# Patient Record
Sex: Male | Born: 1940 | Race: White | Hispanic: No | Marital: Married | Smoking: Former smoker
Health system: Southern US, Community
[De-identification: ages and names within clinical notes are randomized; demographics above are authoritative.]

## PROBLEM LIST (undated history)

## (undated) DIAGNOSIS — Z87442 Personal history of urinary calculi: Secondary | ICD-10-CM

## (undated) DIAGNOSIS — H409 Unspecified glaucoma: Secondary | ICD-10-CM

## (undated) DIAGNOSIS — E119 Type 2 diabetes mellitus without complications: Secondary | ICD-10-CM

## (undated) DIAGNOSIS — E785 Hyperlipidemia, unspecified: Secondary | ICD-10-CM

## (undated) DIAGNOSIS — I10 Essential (primary) hypertension: Secondary | ICD-10-CM

## (undated) DIAGNOSIS — J189 Pneumonia, unspecified organism: Secondary | ICD-10-CM

## (undated) DIAGNOSIS — T4145XA Adverse effect of unspecified anesthetic, initial encounter: Secondary | ICD-10-CM

## (undated) HISTORY — PX: TONSILLECTOMY: SUR1361

## (undated) HISTORY — PX: KIDNEY STONE SURGERY: SHX686

## (undated) HISTORY — PX: EYE SURGERY: SHX253

## (undated) HISTORY — DX: Type 2 diabetes mellitus without complications: E11.9

## (undated) HISTORY — DX: Hyperlipidemia, unspecified: E78.5

## (undated) HISTORY — PX: HERNIA REPAIR: SHX51

## (undated) HISTORY — PX: OTHER SURGICAL HISTORY: SHX169

---

## 2003-02-02 ENCOUNTER — Ambulatory Visit (HOSPITAL_COMMUNITY): Admission: RE | Admit: 2003-02-02 | Discharge: 2003-02-03 | Payer: Self-pay | Admitting: Ophthalmology

## 2003-02-11 ENCOUNTER — Ambulatory Visit (HOSPITAL_COMMUNITY): Admission: RE | Admit: 2003-02-11 | Discharge: 2003-02-12 | Payer: Self-pay | Admitting: Ophthalmology

## 2003-10-26 ENCOUNTER — Ambulatory Visit (HOSPITAL_COMMUNITY): Admission: RE | Admit: 2003-10-26 | Discharge: 2003-10-27 | Payer: Self-pay | Admitting: Ophthalmology

## 2004-12-14 ENCOUNTER — Ambulatory Visit (HOSPITAL_COMMUNITY): Admission: RE | Admit: 2004-12-14 | Discharge: 2004-12-14 | Payer: Self-pay | Admitting: Ophthalmology

## 2005-01-02 ENCOUNTER — Ambulatory Visit (HOSPITAL_COMMUNITY): Admission: RE | Admit: 2005-01-02 | Discharge: 2005-01-02 | Payer: Self-pay | Admitting: Ophthalmology

## 2005-06-04 ENCOUNTER — Ambulatory Visit (HOSPITAL_COMMUNITY): Admission: RE | Admit: 2005-06-04 | Discharge: 2005-06-04 | Payer: Self-pay | Admitting: Ophthalmology

## 2012-08-18 ENCOUNTER — Encounter (INDEPENDENT_AMBULATORY_CARE_PROVIDER_SITE_OTHER): Payer: Self-pay | Admitting: Surgery

## 2012-08-18 ENCOUNTER — Ambulatory Visit (INDEPENDENT_AMBULATORY_CARE_PROVIDER_SITE_OTHER): Payer: BC Managed Care – PPO | Admitting: Surgery

## 2012-08-18 VITALS — BP 123/80 | HR 79 | Temp 98.6°F | Resp 18 | Ht 67.0 in | Wt 186.8 lb

## 2012-08-18 DIAGNOSIS — K4090 Unilateral inguinal hernia, without obstruction or gangrene, not specified as recurrent: Secondary | ICD-10-CM

## 2012-08-18 NOTE — Progress Notes (Signed)
Patient ID: Adam Whitaker, male   DOB: 09/19/1940, 72 y.o.   MRN: [id number]  Chief Complaint  Patient presents with  . Hernia    HPI [name] is a 72 y.o. male.  Referred by Dr. Karrar Husain for evaluation of right inguinal hernia HPI This is a 72-year-old male in good health who presents with a history of a small bulge in his right groin. This is been present for for 5 years. It has become larger and more noticeable. The patient is fairly active. He is planning on participating in an over-70 body building competition. He exercises vigorously and what to have this hernia fixed as it is interfering with his daily activities. He denies any obstructive symptoms. The hernia remains reducible. Past Medical History  Diagnosis Date  . Diabetes mellitus without complication   . Hyperlipidemia     Past Surgical History  Procedure Date  . Kidney stone surgery     No family history on file.  Social History History  Substance Use Topics  . Smoking status: Never Smoker   . Smokeless tobacco: Not on file  . Alcohol Use: No    No Known Allergies  Current Outpatient Prescriptions  Medication Sig Dispense Refill  . amLODipine (NORVASC) 5 MG tablet Take 5 mg by mouth daily.      . atorvastatin (LIPITOR) 10 MG tablet Take 10 mg by mouth daily.      . beta carotene w/minerals (OCUVITE) tablet Take 1 tablet by mouth daily.      . cholecalciferol (VITAMIN D) 1000 UNITS tablet Take 1,000 Units by mouth daily.      . fish oil-omega-3 fatty acids 1000 MG capsule Take 2 g by mouth daily.      . metFORMIN (GLUCOPHAGE) 500 MG tablet Take 500 mg by mouth 2 (two) times daily with a meal.        Review of Systems Review of Systems  Constitutional: Negative for fever, chills and unexpected weight change.  HENT: Negative for hearing loss, congestion, sore throat, trouble swallowing and voice change.   Eyes: Negative for visual disturbance.  Respiratory: Negative for cough and wheezing.    Cardiovascular: Negative for chest pain, palpitations and leg swelling.  Gastrointestinal: Negative for nausea, vomiting, abdominal pain, diarrhea, constipation, blood in stool, abdominal distention, anal bleeding and rectal pain.  Genitourinary: Positive for scrotal swelling (Right groin swelling). Negative for hematuria and difficulty urinating.  Musculoskeletal: Negative for arthralgias.  Skin: Negative for rash and wound.  Neurological: Negative for seizures, syncope, weakness and headaches.  Hematological: Negative for adenopathy. Does not bruise/bleed easily.  Psychiatric/Behavioral: Negative for confusion.    Blood pressure 123/80, pulse 79, temperature 98.6 F (37 C), temperature source Temporal, resp. rate 18, height 5' 7" (1.702 m), weight 186 lb 12.8 oz (84.732 kg).  Physical Exam Physical Exam WDWN in NAD HEENT:  EOMI, sclera anicteric Neck:  No masses, no thyromegaly Lungs:  CTA bilaterally; normal respiratory effort CV:  Regular rate and rhythm; no murmurs Abd:  +bowel sounds, soft, non-tender, no masses GU:  Bilateral descended testes; no testicular masses; reducible right inguinal hernia; no sign of left inguinal hernia  Ext:  Well-perfused; no edema Skin:  Warm, dry; no sign of jaundice  Data Reviewed none  Assessment    Right inguinal hernia - reducible    Plan    Right inguinal hernia repair with mesh.  The surgical procedure has been discussed with the patient.  Potential risks, benefits, alternative treatments,   and expected outcomes have been explained.  All of the patient's questions at this time have been answered.  The likelihood of reaching the patient's treatment goal is good.  The patient understand the proposed surgical procedure and wishes to proceed.        [name]. 08/18/2012, 2:38 PM    

## 2012-08-29 ENCOUNTER — Encounter (HOSPITAL_COMMUNITY): Payer: Self-pay | Admitting: Pharmacy Technician

## 2012-09-01 ENCOUNTER — Encounter (HOSPITAL_COMMUNITY)
Admission: RE | Admit: 2012-09-01 | Discharge: 2012-09-01 | Disposition: A | Discharge status: Home / Self Care | Payer: BC Managed Care – PPO | Source: Ambulatory Visit | Attending: Surgery | Admitting: Surgery

## 2012-09-01 ENCOUNTER — Encounter (HOSPITAL_COMMUNITY)
Admission: RE | Admit: 2012-09-01 | Discharge: 2012-09-01 | Disposition: A | Discharge status: Home / Self Care | Payer: BC Managed Care – PPO | Source: Ambulatory Visit | Attending: Anesthesiology | Admitting: Anesthesiology

## 2012-09-01 ENCOUNTER — Encounter (HOSPITAL_COMMUNITY): Payer: Self-pay

## 2012-09-01 HISTORY — DX: Essential (primary) hypertension: I10

## 2012-09-01 LAB — BASIC METABOLIC PANEL
Calcium: 10.4 mg/dL (ref 8.4–10.5)
Creatinine, Ser: 1.1 mg/dL (ref 0.50–1.35)
GFR calc Af Amer: 76 mL/min — ABNORMAL LOW (ref >90)
GFR calc non Af Amer: 66 mL/min — ABNORMAL LOW (ref >90)
Sodium: 140 mEq/L (ref 135–145)

## 2012-09-01 LAB — CBC
MCH: 31.3 pg (ref 26.0–34.0)
MCHC: 33.9 g/dL (ref 30.0–36.0)
MCV: 92.2 fL (ref 78.0–100.0)
Platelets: 186 10*3/uL (ref 150–400)
RDW: 13.5 % (ref 11.5–15.5)

## 2012-09-01 LAB — SURGICAL PCR SCREEN: MRSA, PCR: NEGATIVE

## 2012-09-01 NOTE — Pre-Procedure Instructions (Signed)
Adam Whitaker  09/01/2012   Your procedure is scheduled on:  Thursday, 09/11/2012@7 :30AM.  Report to [facility] at 5:30 AM.  Call this number if you have problems the morning of surgery: [phone]   Remember:   Do not eat food or drink liquids after midnight.   Take these medicines the morning of surgery with A SIP OF WATER: Amlodipine   Do not wear jewelry, make-up or nail polish.  Do not wear lotions, powders, or perfumes. You may wear deodorant.  Do not shave 48 hours prior to surgery. Men may shave face and neck.  Do not bring valuables to the hospital.  Contacts, dentures or bridgework may not be worn into surgery.  Leave suitcase in the car. After surgery it may be brought to your room.  For patients admitted to the hospital, checkout time is 11:00 AM the day of discharge.   Patients discharged the day of surgery will not be allowed to drive home.  Name and phone number of your driver: [name], wife  Special Instructions: Shower using CHG 2 nights before surgery and the night before surgery.  If you shower the day of surgery use CHG.  Use special wash - you have one bottle of CHG for all showers.  You should use approximately 1/3 of the bottle for each shower.   Please read over the following fact sheets that you were given: Pain Booklet, Coughing and Deep Breathing and Surgical Site Infection Prevention

## 2012-09-01 NOTE — Progress Notes (Signed)
Pt here for PAT.  Sleep study/apnea: Denies ECHO: Denies Stress: Denies Heart Cath: Denies  Family MD:  Dr. [facility].

## 2012-09-10 MED ORDER — CEFAZOLIN SODIUM-DEXTROSE 2-3 GM-% IV SOLR
2.0000 g | INTRAVENOUS | Status: AC
  Administered 2012-09-11: 2 g via INTRAVENOUS
  Filled 2012-09-10: qty 50

## 2012-09-10 MED ORDER — CHLORHEXIDINE GLUCONATE 4 % EX LIQD: 1.0000 "application " | Freq: Once | CUTANEOUS | Status: DC

## 2012-09-11 ENCOUNTER — Encounter (HOSPITAL_COMMUNITY): Payer: Self-pay | Admitting: Anesthesiology

## 2012-09-11 ENCOUNTER — Ambulatory Visit (HOSPITAL_COMMUNITY)
Admission: RE | Admit: 2012-09-11 | Discharge: 2012-09-11 | Disposition: A | Discharge status: Home / Self Care | Payer: BC Managed Care – PPO | Source: Ambulatory Visit | Attending: Surgery | Admitting: Surgery

## 2012-09-11 ENCOUNTER — Encounter (HOSPITAL_COMMUNITY)
Admission: RE | Disposition: A | Discharge status: Home / Self Care | Payer: Self-pay | Source: Ambulatory Visit | Attending: Surgery

## 2012-09-11 ENCOUNTER — Encounter (HOSPITAL_COMMUNITY): Payer: Self-pay | Admitting: *Deleted

## 2012-09-11 ENCOUNTER — Ambulatory Visit (HOSPITAL_COMMUNITY): Payer: BC Managed Care – PPO | Admitting: Anesthesiology

## 2012-09-11 DIAGNOSIS — E785 Hyperlipidemia, unspecified: Secondary | ICD-10-CM | POA: Insufficient documentation

## 2012-09-11 DIAGNOSIS — I10 Essential (primary) hypertension: Secondary | ICD-10-CM | POA: Insufficient documentation

## 2012-09-11 DIAGNOSIS — Z01818 Encounter for other preprocedural examination: Secondary | ICD-10-CM | POA: Insufficient documentation

## 2012-09-11 DIAGNOSIS — K4090 Unilateral inguinal hernia, without obstruction or gangrene, not specified as recurrent: Secondary | ICD-10-CM

## 2012-09-11 DIAGNOSIS — T8859XA Other complications of anesthesia, initial encounter: Secondary | ICD-10-CM

## 2012-09-11 DIAGNOSIS — E119 Type 2 diabetes mellitus without complications: Secondary | ICD-10-CM | POA: Insufficient documentation

## 2012-09-11 DIAGNOSIS — Z01812 Encounter for preprocedural laboratory examination: Secondary | ICD-10-CM | POA: Insufficient documentation

## 2012-09-11 DIAGNOSIS — Z01810 Encounter for preprocedural cardiovascular examination: Secondary | ICD-10-CM | POA: Insufficient documentation

## 2012-09-11 HISTORY — PX: INSERTION OF MESH: SHX5868

## 2012-09-11 HISTORY — PX: INGUINAL HERNIA REPAIR: SHX194

## 2012-09-11 HISTORY — DX: Other complications of anesthesia, initial encounter: T88.59XA

## 2012-09-11 LAB — GLUCOSE, CAPILLARY: Glucose-Capillary: 130 mg/dL — ABNORMAL HIGH (ref 70–99)

## 2012-09-11 SURGERY — REPAIR, HERNIA, INGUINAL, ADULT
Anesthesia: General | Site: Abdomen | Laterality: Right | Wound class: Clean

## 2012-09-11 MED ORDER — ONDANSETRON HCL 4 MG/2ML IJ SOLN: 4.0000 mg | INTRAMUSCULAR | Status: DC | PRN

## 2012-09-11 MED ORDER — 0.9 % SODIUM CHLORIDE (POUR BTL) OPTIME
TOPICAL | Status: DC | PRN
  Administered 2012-09-11: 1000 mL

## 2012-09-11 MED ORDER — ONDANSETRON HCL 4 MG/2ML IJ SOLN
INTRAMUSCULAR | Status: DC | PRN
  Administered 2012-09-11: 4 mg via INTRAVENOUS

## 2012-09-11 MED ORDER — OXYCODONE HCL 5 MG PO TABS: 5.0000 mg | ORAL_TABLET | Freq: Once | ORAL | Status: DC | PRN

## 2012-09-11 MED ORDER — PROMETHAZINE HCL 25 MG/ML IJ SOLN: 6.2500 mg | INTRAMUSCULAR | Status: DC | PRN

## 2012-09-11 MED ORDER — OXYCODONE-ACETAMINOPHEN 5-325 MG PO TABS: 1.0000 | ORAL_TABLET | ORAL | Status: DC | PRN

## 2012-09-11 MED ORDER — HYDROMORPHONE HCL PF 1 MG/ML IJ SOLN
INTRAMUSCULAR | Status: AC
  Filled 2012-09-11: qty 1

## 2012-09-11 MED ORDER — PROPOFOL 10 MG/ML IV BOLUS
INTRAVENOUS | Status: DC | PRN
  Administered 2012-09-11: 150 mg via INTRAVENOUS

## 2012-09-11 MED ORDER — HYDROMORPHONE HCL PF 1 MG/ML IJ SOLN
0.2500 mg | INTRAMUSCULAR | Status: DC | PRN
  Administered 2012-09-11 (×2): 0.5 mg via INTRAVENOUS

## 2012-09-11 MED ORDER — BUPIVACAINE-EPINEPHRINE 0.25% -1:200000 IJ SOLN
INTRAMUSCULAR | Status: DC | PRN
  Administered 2012-09-11: 30 mL

## 2012-09-11 MED ORDER — MIDAZOLAM HCL 5 MG/5ML IJ SOLN
INTRAMUSCULAR | Status: DC | PRN
  Administered 2012-09-11: 2 mg via INTRAVENOUS

## 2012-09-11 MED ORDER — LACTATED RINGERS IV SOLN
INTRAVENOUS | Status: DC | PRN
  Administered 2012-09-11 (×2): via INTRAVENOUS

## 2012-09-11 MED ORDER — FENTANYL CITRATE 0.05 MG/ML IJ SOLN
INTRAMUSCULAR | Status: DC | PRN
  Administered 2012-09-11: 25 ug via INTRAVENOUS
  Administered 2012-09-11: 100 ug via INTRAVENOUS

## 2012-09-11 MED ORDER — BUPIVACAINE-EPINEPHRINE PF 0.5-1:200000 % IJ SOLN
INTRAMUSCULAR | Status: DC | PRN
  Administered 2012-09-11: 100 mg

## 2012-09-11 MED ORDER — OXYCODONE HCL 5 MG/5ML PO SOLN: 5.0000 mg | Freq: Once | ORAL | Status: DC | PRN

## 2012-09-11 MED ORDER — BUPIVACAINE-EPINEPHRINE PF 0.25-1:200000 % IJ SOLN
INTRAMUSCULAR | Status: AC
  Filled 2012-09-11: qty 30

## 2012-09-11 MED ORDER — MORPHINE SULFATE 2 MG/ML IJ SOLN: 2.0000 mg | INTRAMUSCULAR | Status: DC | PRN

## 2012-09-11 SURGICAL SUPPLY — 50 items
APL SKNCLS STERI-STRIP NONHPOA (GAUZE/BANDAGES/DRESSINGS) ×1
BENZOIN TINCTURE PRP APPL 2/3 (GAUZE/BANDAGES/DRESSINGS) ×2 IMPLANT
BLADE SURG 15 STRL LF DISP TIS (BLADE) ×1 IMPLANT
BLADE SURG 15 STRL SS (BLADE) ×2
BLADE SURG ROTATE 9660 (MISCELLANEOUS) ×1 IMPLANT
CHLORAPREP W/TINT 26ML (MISCELLANEOUS) ×2 IMPLANT
CLOTH BEACON ORANGE TIMEOUT ST (SAFETY) ×2 IMPLANT
COVER SURGICAL LIGHT HANDLE (MISCELLANEOUS) ×2 IMPLANT
DECANTER SPIKE VIAL GLASS SM (MISCELLANEOUS) ×2 IMPLANT
DRAIN PENROSE 1/2X12 LTX STRL (WOUND CARE) ×1 IMPLANT
DRAPE LAPAROSCOPIC ABDOMINAL (DRAPES) IMPLANT
DRAPE LAPAROTOMY TRNSV 102X78 (DRAPE) ×1 IMPLANT
DRAPE UTILITY 15X26 W/TAPE STR (DRAPE) ×4 IMPLANT
DRSG TEGADERM 4X4.75 (GAUZE/BANDAGES/DRESSINGS) ×2 IMPLANT
ELECT CAUTERY BLADE 6.4 (BLADE) ×2 IMPLANT
ELECT REM PT RETURN 9FT ADLT (ELECTROSURGICAL) ×2
ELECTRODE REM PT RTRN 9FT ADLT (ELECTROSURGICAL) ×1 IMPLANT
GAUZE SPONGE 4X4 16PLY XRAY LF (GAUZE/BANDAGES/DRESSINGS) ×2 IMPLANT
GLOVE BIO SURGEON STRL SZ7 (GLOVE) ×2 IMPLANT
GLOVE BIOGEL PI IND STRL 7.5 (GLOVE) ×1 IMPLANT
GLOVE BIOGEL PI INDICATOR 7.5 (GLOVE) ×1
GOWN STRL NON-REIN LRG LVL3 (GOWN DISPOSABLE) ×4 IMPLANT
KIT BASIN OR (CUSTOM PROCEDURE TRAY) ×2 IMPLANT
KIT ROOM TURNOVER OR (KITS) ×2 IMPLANT
MESH ULTRAPRO 3X6 7.6X15CM (Mesh General) ×1 IMPLANT
NDL HYPO 25GX1X1/2 BEV (NEEDLE) ×1 IMPLANT
NEEDLE HYPO 25GX1X1/2 BEV (NEEDLE) ×2 IMPLANT
NS IRRIG 1000ML POUR BTL (IV SOLUTION) ×2 IMPLANT
PACK GENERAL/GYN (CUSTOM PROCEDURE TRAY) ×1 IMPLANT
PACK SURGICAL SETUP 50X90 (CUSTOM PROCEDURE TRAY) ×1 IMPLANT
PAD ARMBOARD 7.5X6 YLW CONV (MISCELLANEOUS) ×2 IMPLANT
PENCIL BUTTON HOLSTER BLD 10FT (ELECTRODE) ×1 IMPLANT
SPECIMEN JAR SMALL (MISCELLANEOUS) IMPLANT
SPONGE GAUZE 4X4 12PLY (GAUZE/BANDAGES/DRESSINGS) ×2 IMPLANT
SPONGE INTESTINAL PEANUT (DISPOSABLE) ×2 IMPLANT
STRIP CLOSURE SKIN 1/2X4 (GAUZE/BANDAGES/DRESSINGS) ×2 IMPLANT
SUT MNCRL AB 4-0 PS2 18 (SUTURE) ×2 IMPLANT
SUT PDS AB 0 CT 36 (SUTURE) IMPLANT
SUT PROLENE 2 0 SH DA (SUTURE) ×2 IMPLANT
SUT SILK 2 0 SH (SUTURE) IMPLANT
SUT SILK 3 0 (SUTURE) ×2
SUT SILK 3-0 18XBRD TIE 12 (SUTURE) ×1 IMPLANT
SUT VIC AB 0 CT2 27 (SUTURE) ×1 IMPLANT
SUT VIC AB 2-0 SH 27 (SUTURE) ×2
SUT VIC AB 2-0 SH 27X BRD (SUTURE) ×1 IMPLANT
SUT VIC AB 3-0 SH 27 (SUTURE) ×4
SUT VIC AB 3-0 SH 27XBRD (SUTURE) ×1 IMPLANT
SYR CONTROL 10ML LL (SYRINGE) ×2 IMPLANT
TOWEL OR 17X24 6PK STRL BLUE (TOWEL DISPOSABLE) ×2 IMPLANT
TOWEL OR 17X26 10 PK STRL BLUE (TOWEL DISPOSABLE) ×2 IMPLANT

## 2012-09-11 NOTE — Anesthesia Procedure Notes (Signed)
Anesthesia Regional Block:  TAP block  Pre-Anesthetic Checklist: ,, timeout performed, Correct Patient, Correct Site, Correct Laterality, Correct Procedure, Correct Position, site marked, Risks and benefits discussed, Surgical consent,  Pre-op evaluation,  Post-op pain management  Laterality: Right  Prep: chloraprep       Needles:  Injection technique: Single-shot     Needle Length: 10cm 10 cm Needle Gauge: 20 and 20 G    Additional Needles:  Procedures: ultrasound guided (picture in chart) TAP block Narrative:  Start time: 09/11/2012 7:17 AM End time: 09/11/2012 7:27 AM Injection made incrementally with aspirations every 5 mL.  Performed by: Personally

## 2012-09-11 NOTE — Progress Notes (Signed)
Dr. Krista Blue at chair side to examine patient.

## 2012-09-11 NOTE — Progress Notes (Signed)
Patient preparing for discharge and patient voiced complaint of soreness in right upper abdomen. Nurse examined abdomen and noticed a large area of firm swelling. Nurse instructed patient to sit back down in recliner. Will notify Dr. Corliss Skains of this.

## 2012-09-11 NOTE — Progress Notes (Signed)
Nurse called in OR room 8 and spoke with Rich and informed him of situation. He reported this to Dr. Corliss Skains. Dr. Corliss Skains stated that he would come examine patient after he finished with current case. Patient and wife notified of this.

## 2012-09-11 NOTE — H&P (View-Only) (Signed)
Patient ID: Adam Whitaker, male   DOB: 05-16-1941, 72 y.o.   MRN: [id number]  Chief Complaint  Patient presents with  . Hernia    HPI Adam Whitaker is a 72 y.o. male.  Referred by Dr. Georgann Housekeeper for evaluation of right inguinal hernia HPI This is a 72 year old male in good health who presents with a history of a small bulge in his right groin. This is been present for for 5 years. It has become larger and more noticeable. The patient is fairly active. He is planning on participating in an over-70 body building competition. He exercises vigorously and what to have this hernia fixed as it is interfering with his daily activities. He denies any obstructive symptoms. The hernia remains reducible. Past Medical History  Diagnosis Date  . Diabetes mellitus without complication   . Hyperlipidemia     Past Surgical History  Procedure Date  . Kidney stone surgery     No family history on file.  Social History History  Substance Use Topics  . Smoking status: Never Smoker   . Smokeless tobacco: Not on file  . Alcohol Use: No    No Known Allergies  Current Outpatient Prescriptions  Medication Sig Dispense Refill  . amLODipine (NORVASC) 5 MG tablet Take 5 mg by mouth daily.      Marland Kitchen atorvastatin (LIPITOR) 10 MG tablet Take 10 mg by mouth daily.      . beta carotene w/minerals (OCUVITE) tablet Take 1 tablet by mouth daily.      . cholecalciferol (VITAMIN D) 1000 UNITS tablet Take 1,000 Units by mouth daily.      . fish oil-omega-3 fatty acids 1000 MG capsule Take 2 g by mouth daily.      . metFORMIN (GLUCOPHAGE) 500 MG tablet Take 500 mg by mouth 2 (two) times daily with a meal.        Review of Systems Review of Systems  Constitutional: Negative for fever, chills and unexpected weight change.  HENT: Negative for hearing loss, congestion, sore throat, trouble swallowing and voice change.   Eyes: Negative for visual disturbance.  Respiratory: Negative for cough and wheezing.    Cardiovascular: Negative for chest pain, palpitations and leg swelling.  Gastrointestinal: Negative for nausea, vomiting, abdominal pain, diarrhea, constipation, blood in stool, abdominal distention, anal bleeding and rectal pain.  Genitourinary: Positive for scrotal swelling (Right groin swelling). Negative for hematuria and difficulty urinating.  Musculoskeletal: Negative for arthralgias.  Skin: Negative for rash and wound.  Neurological: Negative for seizures, syncope, weakness and headaches.  Hematological: Negative for adenopathy. Does not bruise/bleed easily.  Psychiatric/Behavioral: Negative for confusion.    Blood pressure 123/80, pulse 79, temperature 98.6 F (37 C), temperature source Temporal, resp. rate 18, height 5\' 7"  (1.702 m), weight 186 lb 12.8 oz (84.732 kg).  Physical Exam Physical Exam WDWN in NAD HEENT:  EOMI, sclera anicteric Neck:  No masses, no thyromegaly Lungs:  CTA bilaterally; normal respiratory effort CV:  Regular rate and rhythm; no murmurs Abd:  +bowel sounds, soft, non-tender, no masses GU:  Bilateral descended testes; no testicular masses; reducible right inguinal hernia; no sign of left inguinal hernia  Ext:  Well-perfused; no edema Skin:  Warm, dry; no sign of jaundice  Data Reviewed none  Assessment    Right inguinal hernia - reducible    Plan    Right inguinal hernia repair with mesh.  The surgical procedure has been discussed with the patient.  Potential risks, benefits, alternative treatments,  and expected outcomes have been explained.  All of the patient's questions at this time have been answered.  The likelihood of reaching the patient's treatment goal is good.  The patient understand the proposed surgical procedure and wishes to proceed.        Tamelia Michalowski K. 08/18/2012, 2:38 PM

## 2012-09-11 NOTE — Preoperative (Signed)
Beta Blockers   Reason not to administer Beta Blockers:Not Applicable 

## 2012-09-11 NOTE — Progress Notes (Signed)
Currently patient is sitting in recliner with ice pack on right flank area. Wife at chair side. Will continue to closely monitor.

## 2012-09-11 NOTE — Interval H&P Note (Signed)
History and Physical Interval Note:  09/11/2012 7:17 AM  Adam Whitaker  has presented today for surgery, with the diagnosis of right inguinal hernia   The various methods of treatment have been discussed with the patient and family. After consideration of risks, benefits and other options for treatment, the patient has consented to  Procedure(s) (LRB) with comments: HERNIA REPAIR INGUINAL ADULT (Right) INSERTION OF MESH (Right) as a surgical intervention .  The patient's history has been reviewed, patient examined, no change in status, stable for surgery.  I have reviewed the patient's chart and labs.  Questions were answered to the patient's satisfaction.     Avy Barlett K.

## 2012-09-11 NOTE — Progress Notes (Signed)
Orthopedic Tech Progress Note Patient Details:  Adam Whitaker 1940/11/04 [id number] Patient experienced abdominal swelling in Short Stay and order was placed for abdominal binder. Binder delivered to patient and applied by nurse and wife. Ortho Devices Type of Ortho Device: Abdominal binder Ortho Device/Splint Interventions: Ordered   [place] R Thompson 09/11/2012, 12:11 PM

## 2012-09-11 NOTE — Progress Notes (Signed)
Dr. Corliss Skains at bedside. Dr. Krista Blue called and informed of swelling noted on right flank.

## 2012-09-11 NOTE — Op Note (Signed)
Hernia, Open, Procedure Note  Indications: The patient presented with a history of a right, reducible inguinal hernia.    Pre-operative Diagnosis: right reducible inguinal hernia Post-operative Diagnosis: same  Surgeon: Wynona Luna.   Assistants: none  Anesthesia: General LMA anesthesia and TAP block  ASA Class: 2  Procedure Details  The patient was seen again in the Holding Room. The risks, benefits, complications, treatment options, and expected outcomes were discussed with the patient. The possibilities of reaction to medication, pulmonary aspiration, perforation of viscus, bleeding, recurrent infection, the need for additional procedures, and development of a complication requiring transfusion or further operation were discussed with the patient and/or family. The likelihood of success in repairing the hernia and returning the patient to their previous functional status is good.  There was concurrence with the proposed plan, and informed consent was obtained. The site of surgery was properly noted/marked. The patient was taken to the Operating Room, identified as Adam Whitaker, and the procedure verified as right inguinal hernia repair. A Time Out was held and the above information confirmed.  The patient was placed in the supine position and underwent induction of anesthesia. The lower abdomen and groin was prepped with Chloraprep and draped in the standard fashion, and 0.25% Marcaine with epinephrine was used to anesthetize the skin over the mid-portion of the inguinal canal. An oblique incision was made. Dissection was carried down through the subcutaneous tissue with cautery to the external oblique fascia.  We opened the external oblique fascia along the direction of its fibers to the external ring.  The spermatic cord was circumferentially dissected bluntly and retracted with a Penrose drain.  The floor of the inguinal canal was inspected and a large direct hernia was identified.  This was carefully dissected free and reduced.  The floor of the inguinal canal was closed with 0 Vicryl.  We skeletonized the spermatic cord and no indirect hernia sac was noted.  We used a 3 x 6 inch piece of Ultrapro mesh, which was cut into a keyhole shape.  This was secured with 2-0 Prolene, beginning at the pubic tubercle, running this along the internal oblique fascia superiorly and the shelving edge inferiorly.  The tails of the mesh were sutured together behind the spermatic cord.  The mesh was tucked underneath the external oblique fascia laterally.  The external oblique fascia was reapproximated with 2-0 Vicryl.  3-0 Vicryl was used to close the subcutaneous tissues and 4-0 Monocryl was used to close the skin in subcuticular fashion.  Benzoin and steri-strips were used to seal the incision.  A clean dressing was applied.  The patient was then extubated and brought to the recovery room in stable condition.  All sponge, instrument, and needle counts were correct prior to closure and at the conclusion of the case.   Estimated Blood Loss: Minimal                 Complications: None; patient tolerated the procedure well.         Disposition: PACU - hemodynamically stable.         Condition: stable  Wilmon Arms. Corliss Skains, MD, [facility]  09/11/2012 8:49 AM

## 2012-09-11 NOTE — Transfer of Care (Signed)
Immediate Anesthesia Transfer of Care Note  Patient: Adam Whitaker  Procedure(s) Performed: Procedure(s) (LRB) with comments: HERNIA REPAIR INGUINAL ADULT (Right) INSERTION OF MESH (Right)  Patient Location: PACU  Anesthesia Type:General and GA combined with regional for post-op pain  Level of Consciousness: awake, alert  and oriented  Airway & Oxygen Therapy: Patient Spontanous Breathing and Patient connected to nasal cannula oxygen  Post-op Assessment: Report given to PACU RN and Post -op Vital signs reviewed and stable  Post vital signs: Reviewed and stable  Complications: No apparent anesthesia complications

## 2012-09-11 NOTE — Anesthesia Postprocedure Evaluation (Signed)
Anesthesia Post Note  Patient: Adam Whitaker  Procedure(s) Performed: Procedure(s) (LRB): HERNIA REPAIR INGUINAL ADULT (Right) INSERTION OF MESH (Right)  Anesthesia type: general  Patient location: PACU  Post pain: Pain level controlled  Post assessment: Patient's Cardiovascular Status Stable  Last Vitals:  Filed Vitals:   09/11/12 1008  BP: 117/74  Pulse: 50  Temp: 36.3 C  Resp: 14    Post vital signs: Reviewed and stable  Level of consciousness: sedated  Complications: No apparent anesthesia complications

## 2012-09-11 NOTE — Anesthesia Preprocedure Evaluation (Addendum)
Anesthesia Evaluation  Patient identified by MRN, date of birth, ID band Patient awake    Reviewed: Allergy & Precautions, H&P , NPO status , Patient's Chart, lab work & pertinent test results  History of Anesthesia Complications Negative for: history of anesthetic complications  Airway Mallampati: II TM Distance: >3 FB Neck ROM: Full    Dental  (+) Teeth Intact, Dental Advisory Given, Edentulous Upper, Partial Lower and Upper Dentures   Pulmonary neg pulmonary ROS, former smoker,    Pulmonary exam normal       Cardiovascular hypertension, Pt. on medications     Neuro/Psych negative neurological ROS  negative psych ROS   GI/Hepatic negative GI ROS, Neg liver ROS,   Endo/Other  diabetes, Oral Hypoglycemic Agents  Renal/GU Renal InsufficiencyRenal disease     Musculoskeletal negative musculoskeletal ROS (+)   Abdominal   Peds  Hematology negative hematology ROS (+)   Anesthesia Other Findings   Reproductive/Obstetrics negative OB ROS                         Anesthesia Physical Anesthesia Plan  ASA: III  Anesthesia Plan: General   Post-op Pain Management:    Induction: Intravenous  Airway Management Planned: LMA  Additional Equipment:   Intra-op Plan:   Post-operative Plan: Extubation in OR  Informed Consent: I have reviewed the patients History and Physical, chart, labs and discussed the procedure including the risks, benefits and alternatives for the proposed anesthesia with the patient or authorized representative who has indicated his/her understanding and acceptance.   Dental advisory given  Plan Discussed with: CRNA, Anesthesiologist and Surgeon  Anesthesia Plan Comments:         Anesthesia Quick Evaluation

## 2012-09-11 NOTE — Progress Notes (Signed)
Called by Short Stay staff to evaluate pt.  Was doing fine with minimal pain until going to the bathroom to urinate.  He developed sudden onset of pain and swelling in his right flank.  He reports pain with walking and deep breath and at the site of swelling.  No erythema noted.  Dr. Margaree Mackintosh was notified who evaluated the pt and felt it was a hematoma related to the TAP block.  The surgical site appeared normal.  I evaluated the pt and agree that it is probably a hematoma in the abd muscle related to the TAP block.  The patient was given an abdominal binder and told to lay down as much as possible for the next 24 hours.  He was educated to avoid straining with his abd muscles.  He was told to contact us if his pain is not better in the next 24 hours or if he develops a fever or neurologic signs.

## 2012-09-11 NOTE — Addendum Note (Signed)
Addendum  created 09/11/12 1307 by Remonia Richter, MD   Modules edited:Inpatient Notes

## 2012-09-15 ENCOUNTER — Encounter (HOSPITAL_COMMUNITY): Payer: Self-pay | Admitting: Surgery

## 2012-09-15 ENCOUNTER — Telehealth (INDEPENDENT_AMBULATORY_CARE_PROVIDER_SITE_OTHER): Payer: Self-pay

## 2012-09-15 NOTE — Telephone Encounter (Signed)
Pt called wanting to know why he still has swelling above hernia site. He states hernia site is doing well but still has swelling well above where hernia was repaired. Pt states swelling is slowly going down.  I reviewed this with Dr Corliss Skains. Per Dr Fatima Sanger request pt was advised this is a hematoma from block placed by anesthesia. Dr Corliss Skains recommends pt not use advil,aleve,aspirin or omega 3 until area resolves and to wear his abd binder until he comes in for next ov. Pt advised and states he understands. Pt will call with concerns.

## 2012-09-30 ENCOUNTER — Ambulatory Visit (INDEPENDENT_AMBULATORY_CARE_PROVIDER_SITE_OTHER): Payer: BC Managed Care – PPO | Admitting: Surgery

## 2012-09-30 ENCOUNTER — Encounter (INDEPENDENT_AMBULATORY_CARE_PROVIDER_SITE_OTHER): Payer: Self-pay | Admitting: Surgery

## 2012-09-30 VITALS — BP 139/82 | HR 72 | Temp 97.8°F | Resp 16 | Ht 67.0 in | Wt 184.6 lb

## 2012-09-30 DIAGNOSIS — K4090 Unilateral inguinal hernia, without obstruction or gangrene, not specified as recurrent: Secondary | ICD-10-CM

## 2012-09-30 NOTE — Progress Notes (Signed)
Status post right inguinal hernia repair with mesh on 09/11/12 for a large direct inguinal hernia. He had a TAP block that was complicated by intramuscular hematoma. This became quite large. The hematoma has resolved. He still has some soreness in the muscle but there is no bruising or swelling. No sign of recurrent hernia. His incision is well-healed with no sign of infection. Appetite and bowel movements are normal. He may slowly begin increasing his level of activity over the next 3 weeks and may resume full activity when he is 6 weeks postop. Followup as needed.  Wilmon Arms. Corliss Skains, MD, [facility]  09/30/2012 9:33 AM

## 2013-02-12 ENCOUNTER — Inpatient Hospital Stay (HOSPITAL_COMMUNITY)
Admission: EM | Admit: 2013-02-12 | Discharge: 2013-02-13 | DRG: 083 | Disposition: A | Discharge status: Home Health | Payer: BC Managed Care – PPO | Source: Other Acute Inpatient Hospital | Attending: General Surgery | Admitting: General Surgery

## 2013-02-12 DIAGNOSIS — W11XXXA Fall on and from ladder, initial encounter: Secondary | ICD-10-CM | POA: Diagnosis present

## 2013-02-12 DIAGNOSIS — Z79899 Other long term (current) drug therapy: Secondary | ICD-10-CM

## 2013-02-12 DIAGNOSIS — E119 Type 2 diabetes mellitus without complications: Secondary | ICD-10-CM

## 2013-02-12 DIAGNOSIS — S2249XA Multiple fractures of ribs, unspecified side, initial encounter for closed fracture: Secondary | ICD-10-CM

## 2013-02-12 DIAGNOSIS — S32599A Other specified fracture of unspecified pubis, initial encounter for closed fracture: Secondary | ICD-10-CM

## 2013-02-12 DIAGNOSIS — IMO0002 Reserved for concepts with insufficient information to code with codable children: Secondary | ICD-10-CM

## 2013-02-12 DIAGNOSIS — S3210XA Unspecified fracture of sacrum, initial encounter for closed fracture: Secondary | ICD-10-CM

## 2013-02-12 DIAGNOSIS — S32009A Unspecified fracture of unspecified lumbar vertebra, initial encounter for closed fracture: Secondary | ICD-10-CM

## 2013-02-12 DIAGNOSIS — I10 Essential (primary) hypertension: Secondary | ICD-10-CM | POA: Diagnosis present

## 2013-02-12 DIAGNOSIS — S32509A Unspecified fracture of unspecified pubis, initial encounter for closed fracture: Secondary | ICD-10-CM | POA: Diagnosis present

## 2013-02-12 DIAGNOSIS — Y93E9 Activity, other interior property and clothing maintenance: Secondary | ICD-10-CM

## 2013-02-12 DIAGNOSIS — E785 Hyperlipidemia, unspecified: Secondary | ICD-10-CM | POA: Diagnosis present

## 2013-02-12 DIAGNOSIS — S322XXA Fracture of coccyx, initial encounter for closed fracture: Secondary | ICD-10-CM

## 2013-02-12 LAB — GLUCOSE, CAPILLARY: Glucose-Capillary: 98 mg/dL (ref 70–99)

## 2013-02-12 MED ORDER — ONDANSETRON HCL 4 MG/2ML IJ SOLN: 4.0000 mg | Freq: Four times a day (QID) | INTRAMUSCULAR | Status: DC | PRN

## 2013-02-12 MED ORDER — ONDANSETRON HCL 4 MG PO TABS: 4.0000 mg | ORAL_TABLET | Freq: Four times a day (QID) | ORAL | Status: DC | PRN

## 2013-02-12 MED ORDER — INSULIN ASPART 100 UNIT/ML ~~LOC~~ SOLN: 0.0000 [IU] | Freq: Every day | SUBCUTANEOUS | Status: DC

## 2013-02-12 MED ORDER — ENOXAPARIN SODIUM 40 MG/0.4ML ~~LOC~~ SOLN
40.0000 mg | SUBCUTANEOUS | Status: DC
  Administered 2013-02-12: 40 mg via SUBCUTANEOUS
  Filled 2013-02-12 (×2): qty 0.4

## 2013-02-12 MED ORDER — AMLODIPINE BESYLATE 5 MG PO TABS
5.0000 mg | ORAL_TABLET | Freq: Every day | ORAL | Status: DC
Start: 2013-02-12 — End: 2013-02-13
  Filled 2013-02-12 (×3): qty 1

## 2013-02-12 MED ORDER — POTASSIUM CHLORIDE IN NACL 20-0.9 MEQ/L-% IV SOLN
INTRAVENOUS | Status: DC
  Administered 2013-02-12: 23:00:00 via INTRAVENOUS
  Filled 2013-02-12: qty 1000

## 2013-02-12 MED ORDER — TRAMADOL HCL 50 MG PO TABS
50.0000 mg | ORAL_TABLET | Freq: Four times a day (QID) | ORAL | Status: DC | PRN
  Administered 2013-02-12: 50 mg via ORAL
  Filled 2013-02-12: qty 1

## 2013-02-12 MED ORDER — PANTOPRAZOLE SODIUM 40 MG IV SOLR
40.0000 mg | Freq: Every day | INTRAVENOUS | Status: DC
  Filled 2013-02-12: qty 40

## 2013-02-12 MED ORDER — PANTOPRAZOLE SODIUM 40 MG PO TBEC
40.0000 mg | DELAYED_RELEASE_TABLET | Freq: Every day | ORAL | Status: DC
  Administered 2013-02-13: 40 mg via ORAL
  Filled 2013-02-12: qty 1

## 2013-02-12 MED ORDER — ATORVASTATIN CALCIUM 10 MG PO TABS
10.0000 mg | ORAL_TABLET | Freq: Every morning | ORAL | Status: DC
  Administered 2013-02-13: 10 mg via ORAL
  Filled 2013-02-12: qty 1

## 2013-02-12 MED ORDER — OXYCODONE HCL 5 MG PO TABS
5.0000 mg | ORAL_TABLET | ORAL | Status: DC | PRN
  Administered 2013-02-13: 5 mg via ORAL
  Filled 2013-02-12: qty 1

## 2013-02-12 MED ORDER — INSULIN ASPART 100 UNIT/ML ~~LOC~~ SOLN: 0.0000 [IU] | Freq: Three times a day (TID) | SUBCUTANEOUS | Status: DC

## 2013-02-12 MED ORDER — HYDROMORPHONE HCL PF 1 MG/ML IJ SOLN
0.5000 mg | INTRAMUSCULAR | Status: DC | PRN
  Administered 2013-02-13: 1 mg via INTRAVENOUS
  Filled 2013-02-12: qty 1

## 2013-02-12 NOTE — H&P (Signed)
Adam Whitaker is an 72 y.o. male.   Chief Complaint: left rib pain and pelvic pain HPI: Patient [place] the ladder gave way and he fell. He fell onto some papers on his left side and back. He had immediate pain in his pelvis as well as his left ribs. He was evaluated at [facility] in [place]. He was found to have multiple rib fractures, pelvic fractures, and lumbar spine transverse process fractures. He lives in [place] so he Requested transfer to [facility] trauma service. I accepted him and he was transported via [facility]. He continues to complain of pain in his pelvic area and in his right ribs.  Past Medical History  Diagnosis Date  . Diabetes mellitus without complication   . Hyperlipidemia   . Hypertension   . Chronic kidney disease     Past Surgical History  Procedure Laterality Date  . Kidney stone surgery    . Tonsillectomy    . Eye surgery    . Inguinal hernia repair Right 09/11/2012    Procedure: HERNIA REPAIR INGUINAL ADULT;  Surgeon: Wilmon Arms. Corliss Skains, MD;  Location: [facility] OR;  Service: General;  Laterality: Right;  . Insertion of mesh Right 09/11/2012    Procedure: INSERTION OF MESH;  Surgeon: Wilmon Arms. Corliss Skains, MD;  Location: [facility] OR;  Service: General;  Laterality: Right;  . Hernia repair      No family history on file. Social History:  reports that he quit smoking about 51 years ago. His smoking use included Cigarettes. He smoked 0.50 packs per day. He does not have any smokeless tobacco history on file. He reports that he does not drink alcohol or use illicit drugs.  Allergies: No Known Allergies  Medications Prior to Admission  Medication Sig Dispense Refill  . amLODipine (NORVASC) 5 MG tablet Take 5 mg by mouth at bedtime.       Marland Kitchen atorvastatin (LIPITOR) 10 MG tablet Take 10 mg by mouth every morning.       . beta carotene w/minerals (OCUVITE) tablet Take 1 tablet by mouth 2 (two) times daily.        . cholecalciferol (VITAMIN D) 1000 UNITS tablet Take 1,000 Units by mouth daily.      . fish oil-omega-3 fatty acids 1000 MG capsule Take 1 g by mouth 2 (two) times daily.       . metFORMIN (GLUCOPHAGE) 500 MG tablet Take 500-1,000 mg by mouth 2 (two) times daily with a meal. 1 tablet in the morning and 2 tablets in the evening.      . Multiple Vitamins-Minerals (CENTRUM SILVER PO) Take by mouth.      . oxyCODONE-acetaminophen (PERCOCET/ROXICET) 5-325 MG per tablet Take 1 tablet by mouth every 4 (four) hours as needed for pain.  40 tablet  0    No results found for this or any previous visit (from the past 48 hour(s)). No results found.  Review of Systems  Constitutional: Negative for weight loss.  HENT: Negative for hearing loss, ear pain, neck pain, tinnitus and ear discharge.   Eyes: Negative for blurred vision, double vision, photophobia and pain.  Respiratory: Negative for cough, sputum production and shortness of breath.   Cardiovascular: Positive for chest pain.  Gastrointestinal: Negative for nausea, vomiting and abdominal pain.  Genitourinary: Negative for dysuria, urgency, frequency and flank pain.  Musculoskeletal: Negative for myalgias, back pain, joint pain and falls.  Pelvic pain   Neurological: Negative for dizziness, tingling, sensory change, focal weakness, loss of consciousness and headaches.  Endo/Heme/Allergies: Does not bruise/bleed easily.  Psychiatric/Behavioral: Negative for depression, memory loss and substance abuse. The patient is not nervous/anxious.     There were no vitals taken for this visit. Physical Exam  Vitals reviewed. Constitutional: He is oriented to person, place, and time. He appears well-developed and well-nourished. He is cooperative. No distress.  HENT:  Head: Normocephalic and atraumatic. Head is without raccoon's eyes, without Battle's sign, without abrasion, without contusion and without laceration.  Right Ear: Hearing, tympanic  membrane, external ear and ear canal normal. No lacerations. No drainage or tenderness. No foreign bodies. Tympanic membrane is not perforated. No hemotympanum.  Left Ear: Hearing, tympanic membrane, external ear and ear canal normal. No lacerations. No drainage or tenderness. No foreign bodies. Tympanic membrane is not perforated. No hemotympanum.  Nose: Nose normal. No nose lacerations, sinus tenderness, nasal deformity or nasal septal hematoma. No epistaxis.  Mouth/Throat: Uvula is midline, oropharynx is clear and moist and mucous membranes are normal. No lacerations.  Eyes: Conjunctivae, EOM and lids are normal. Pupils are equal, round, and reactive to light. No scleral icterus.  Neck: Trachea normal. No JVD present. No spinous process tenderness and no muscular tenderness present. Carotid bruit is not present. No thyromegaly present.  Cardiovascular: Normal rate, regular rhythm, normal heart sounds, intact distal pulses and normal pulses.   Respiratory: Effort normal and breath sounds normal. No respiratory distress. He exhibits tenderness. He exhibits no bony tenderness, no laceration and no crepitus.  Left-sided rib tenderness without crepitance  GI: Soft. Normal appearance. He exhibits no distension. Bowel sounds are decreased. There is no tenderness. There is no rigidity, no rebound, no guarding and no CVA tenderness.  Right inguinal hernia repair scar  Musculoskeletal: Normal range of motion. He exhibits no edema and no tenderness.       Legs: Bilateral shin abrasions  Lymphadenopathy:    He has no cervical adenopathy.  Neurological: He is alert and oriented to person, place, and time. He has normal strength. He displays no tremor. No cranial nerve deficit or sensory deficit. He exhibits normal muscle tone. He displays no seizure activity. GCS eye subscore is 4. GCS verbal subscore is 5. GCS motor subscore is 6.  Lower extremity movement is limited somewhat due to pain  Skin: Skin is  warm, dry and intact. He is not diaphoretic.  Psychiatric: He has a normal mood and affect. His speech is normal and behavior is normal.     Assessment/Plan Status post fall with left-sided rib fractures x4, L2 and L3 transverse process fractures, right superior and inferior pubic rami fractures, left sacral fracture. Patient also has hypertension and diabetes. We'll hold [employer]. We will work on pain control and pulmonary toilet. I consulted Dr. Magnus Ivan from orthopedics regarding his pelvic fractures. Once he is evaluated from an orthopedic standpoint we will plan to mobilize as we are able with physical therapy. Plan was discussed in detail with the patient and his wife.  Tin Engram E 02/12/2013, 9:47 PM

## 2013-02-13 ENCOUNTER — Encounter (HOSPITAL_COMMUNITY): Payer: Self-pay | Admitting: *Deleted

## 2013-02-13 ENCOUNTER — Inpatient Hospital Stay (HOSPITAL_COMMUNITY): Payer: BC Managed Care – PPO

## 2013-02-13 LAB — BASIC METABOLIC PANEL
CO2: 29 mEq/L (ref 19–32)
Calcium: 9.1 mg/dL (ref 8.4–10.5)
Creatinine, Ser: 1.01 mg/dL (ref 0.50–1.35)

## 2013-02-13 LAB — CBC
MCH: 31.7 pg (ref 26.0–34.0)
MCHC: 33.6 g/dL (ref 30.0–36.0)
MCV: 94.5 fL (ref 78.0–100.0)
Platelets: 144 10*3/uL — ABNORMAL LOW (ref 150–400)
RBC: 4.16 MIL/uL — ABNORMAL LOW (ref 4.22–5.81)
RDW: 14.2 % (ref 11.5–15.5)

## 2013-02-13 MED ORDER — SODIUM CHLORIDE 0.9 % IJ SOLN: 3.0000 mL | INTRAMUSCULAR | Status: DC | PRN

## 2013-02-13 MED ORDER — TRAMADOL HCL 50 MG PO TABS: 50.0000 mg | ORAL_TABLET | Freq: Four times a day (QID) | ORAL | Status: DC | PRN

## 2013-02-13 NOTE — Progress Notes (Signed)
UR completed 

## 2013-02-13 NOTE — Discharge Summary (Signed)
Physician Discharge Summary  Patient ID: Adam Whitaker MRN: [id number] DOB/AGE: 1941/02/15 72 y.o.  Admit date: 02/12/2013 Discharge date: 02/13/2013  Discharge Diagnoses Patient Active Problem List   Diagnosis Date Noted  . Type II or unspecified type diabetes mellitus without mention of complication, not stated as uncontrolled 02/12/2013  . Fracture of multiple ribs 02/12/2013  . Fracture of transverse process of spine without spinal cord lesion 02/12/2013  . Sacral fracture, closed 02/12/2013  . Closed fracture of multiple pubic rami 02/12/2013  . Right inguinal hernia 08/18/2012    Consultants Dr. Doneen Poisson for orthopedic surgery   Procedures None   HPI: [name] was [place] the ladder gave way and he fell. He fell onto some papers on his left side and back. He had immediate pain in his pelvis as well as his left ribs. He was evaluated at [facility] in [place]. He was found to have multiple rib fractures, pelvic fractures, and lumbar spine transverse process fractures. He lives in [place] so he Requested transfer to [facility] trauma service. Orthopedic surgery was consulted and he was admitted for pain control and mobilization.   Hospital Course: The patient did well overnight in the hospital. Orthopedic surgery recommended nonoperative treatment for his pelvic fractures and mobilization as tolerated. Physical and occupational therapies cleared the patient for discharge to home in the care of his wife. He was discharged in good condition.      Medication List         amLODipine 5 MG tablet  Commonly known as:  NORVASC  Take 5 mg by mouth at bedtime.     atorvastatin 10 MG tablet  Commonly known as:  LIPITOR  Take 10 mg by mouth every morning.     CENTRUM SILVER PO  Take 1 tablet by mouth every morning.     metFORMIN 500 MG tablet  Commonly known as:  GLUCOPHAGE  Take 500-1,000  mg by mouth 2 (two) times daily with a meal. 1 tablet in the morning and 2 tablets in the evening.     traMADol 50 MG tablet  Commonly known as:  ULTRAM  Take 1-2 tablets (50-100 mg total) by mouth every 6 (six) hours as needed for pain (pain).             Follow-up Information   Call [facility]. (As needed)    Contact information:   [address] [place] [address] [phone]       Signed: Freeman Caldron, PA-C Pager: [phone] General Trauma [facility] Pager: [phone]  02/13/2013, 3:36 PM

## 2013-02-13 NOTE — Consult Note (Signed)
Reason for Consult:  Pelvic fractures Referring Physician:   Violeta Gelinas, MD  Adam Whitaker is an 72 y.o. male.  HPI:   72 yo male who sustained a mechanical fall from a considerable height.  Has multiple rib fractures as well as pelvic fractures.  Currently in the ICU for observation due to the ribs fxs.  Ortho asked to eval the pelvic fractures and give recs for mobility.  The patient is awake and alert and denies bilateral LE weakness or numbness.  He reports rib pain and right-sided pelvic pain.  Past Medical History  Diagnosis Date  . Diabetes mellitus without complication   . Hyperlipidemia   . Hypertension   . Chronic kidney disease     Past Surgical History  Procedure Laterality Date  . Kidney stone surgery    . Tonsillectomy    . Eye surgery    . Inguinal hernia repair Right 09/11/2012    Procedure: HERNIA REPAIR INGUINAL ADULT;  Surgeon: Wilmon Arms. Corliss Skains, MD;  Location: [facility] OR;  Service: General;  Laterality: Right;  . Insertion of mesh Right 09/11/2012    Procedure: INSERTION OF MESH;  Surgeon: Wilmon Arms. Corliss Skains, MD;  Location: [facility] OR;  Service: General;  Laterality: Right;  . Hernia repair      History reviewed. No pertinent family history.  Social History:  reports that he quit smoking about 51 years ago. His smoking use included Cigarettes. He smoked 0.50 packs per day. He does not have any smokeless tobacco history on file. He reports that he does not drink alcohol or use illicit drugs.  Allergies: No Known Allergies  Medications: I have reviewed the patient's current medications.  Results for orders placed during the hospital encounter of 02/12/13 (from the past 48 hour(s))  MRSA PCR SCREENING     Status: None   Collection Time    02/12/13  9:19 PM      Result Value Range   MRSA by PCR NEGATIVE  NEGATIVE   Comment:            The GeneXpert MRSA Assay (FDA     approved for NASAL specimens     only), is one component of a     comprehensive MRSA colonization     surveillance program. It is not     intended to diagnose MRSA     infection nor to guide or     monitor treatment for     MRSA infections.  GLUCOSE, CAPILLARY     Status: None   Collection Time    02/12/13 10:28 PM      Result Value Range   Glucose-Capillary 98  70 - 99 mg/dL   Comment 1 Notify RN     Comment 2 Documented in Chart    CBC     Status: Abnormal   Collection Time    02/13/13  5:35 AM      Result Value Range   WBC 5.4  4.0 - 10.5 K/uL   RBC 4.16 (*) 4.22 - 5.81 MIL/uL   Hemoglobin 13.2  13.0 - 17.0 g/dL   HCT 16.1  09.6 - 04.5 %   MCV 94.5  78.0 - 100.0 fL   MCH 31.7  26.0 - 34.0 pg   MCHC 33.6  30.0 - 36.0 g/dL   RDW 40.9  81.1 - 91.4 %   Platelets 144 (*) 150 - 400 K/uL  BASIC METABOLIC PANEL     Status: Abnormal   Collection Time  02/13/13  5:35 AM      Result Value Range   Sodium 138  135 - 145 mEq/L   Potassium 4.3  3.5 - 5.1 mEq/L   Chloride 102  96 - 112 mEq/L   CO2 29  19 - 32 mEq/L   Glucose, Bld 158 (*) 70 - 99 mg/dL   BUN 22  6 - 23 mg/dL   Creatinine, Ser 1.61  0.50 - 1.35 mg/dL   Calcium 9.1  8.4 - 09.6 mg/dL   GFR calc non Af Amer 73 (*) >90 mL/min   GFR calc Af Amer 84 (*) >90 mL/min   Comment:            The eGFR has been calculated     using the CKD EPI equation.     This calculation has not been     validated in all clinical     situations.     eGFR's persistently     <90 mL/min signify     possible Chronic Kidney Disease.    No results found.  ROS Blood pressure 121/62, pulse 62, temperature 98.2 F (36.8 C), temperature source Oral, resp. rate 18, height 5\' 7"  (1.702 m), weight 83.8 kg (184 lb 11.9 oz), SpO2 97.00%. Physical Exam  Musculoskeletal:       Right hip: He exhibits bony tenderness.  NVI bilateral upper and lower ext grossly.  Moves all ext easily. Pelvis stable to AP/Lat compression. Some pain with right hip flexion and palpation over right pubis. Good strength in right and left legs.  CT pelvis is reviewed  and shows right sup/inf pubic rami fractures and a small non-displaced left sacral ala fracture.  Assessment/Plan: Right superior/inferior pubic rami fracture and very minimal left sacral ala fracture 1)  From an orthopedic standpoint, he can be up with full weight bearing as tolerated on his right and left legs.  Should heal with time.  No other current ortho issues.  Will order PT/OT  [name] Y 02/13/2013, 7:03 AM

## 2013-02-13 NOTE — Significant Event (Signed)
Discussed discharge instructions with patient and spouse.  Patient instructed to call Trauma clinic for any difficulties, increased pain not relieved by pain medications, fever, increased nausea and vomiting.  Patient instructed to continue to take deep breaths and cough; use of Incentive spirometer every hour while awake.  Prescription called to [facility] for Ultram.  Patient instructed to take medication 1-2 tablets every 6 hours as needed for pain.  Patient and spouse verbalized understanding of all discharge instructions.

## 2013-02-13 NOTE — Progress Notes (Signed)
Trauma Service Note  Subjective: Patient is doing well.  Has not gotten out of bed at this hospital.Rib pain  Objective: Vital signs in last 24 hours: Temp:  [97.8 F (36.6 C)-98.2 F (36.8 C)] 98.2 F (36.8 C) (07/11 0400) Pulse Rate:  [59-66] 66 (07/11 0700) Resp:  [11-20] 20 (07/11 0700) BP: (108-147)/(57-83) 112/67 mmHg (07/11 0700) SpO2:  [95 %-100 %] 98 % (07/11 0700) Weight:  [83.8 kg (184 lb 11.9 oz)] 83.8 kg (184 lb 11.9 oz) (07/10 2115)    Intake/Output from previous day: 07/10 0701 - 07/11 0700 In: 649.2 [P.O.:580; I.V.:69.2] Out: 400 [Urine:400] Intake/Output this shift:    General: No acute distress  Lungs: Clear to auscultation.  Slightly decreased volume on the left side on CXR  Abd: Soft, benign  Extremities: No DVT signs or symptoms  Neuro: Intact  Lab Results: CBC   Recent Labs  02/13/13 0535  WBC 5.4  HGB 13.2  HCT 39.3  PLT 144*   BMET  Recent Labs  02/13/13 0535  NA 138  K 4.3  CL 102  CO2 29  GLUCOSE 158*  BUN 22  CREATININE 1.01  CALCIUM 9.1   PT/INR No results found for this basename: LABPROT, INR,  in the last 72 hours ABG No results found for this basename: PHART, PCO2, PO2, HCO3,  in the last 72 hours  Studies/Results: Dg Chest Port 1 View  02/13/2013   *RADIOLOGY REPORT*  Clinical Data: Left rib fractures  PORTABLE CHEST - 1 VIEW  Comparison: September 01, 2012.  Findings: Cardiomediastinal silhouette appears normal.  No acute pulmonary disease is noted.  Bony thorax is intact.  IMPRESSION: No acute cardiopulmonary abnormality seen.   Original Report Authenticated By: Lupita Raider.,  M.D.    Anti-infectives: Anti-infectives   None      Assessment/Plan: s/p  Advance diet Transfer to the floor Possibly discharge later today.  LOS: 1 day   Adam Whitaker. Adam Bon, MD, FACS [phone] Trauma Surgeon 02/13/2013

## 2013-02-13 NOTE — Evaluation (Signed)
Physical Therapy Evaluation Patient Details Name: Adam Whitaker MRN: [id number] DOB: [birth year] Today's Date: 02/13/2013 Time: 8469-6295 PT Time Calculation (min): 32 min  PT Assessment / Plan / Recommendation History of Present Illness  Patient is a 72 y/o male transferred from [facility] [place]  Presented with left sided rib fractures x 4, right superiro and inferior pubic rami fractures, L2 and L3 transverse process fractures, and left sacral ala fracture.  H/O HTN and DM  Clinical Impression  Patient presents with limited mobility due to pain, decreased strength, decreased balance, and limited knowledge of DME use and will benefit from skilled PT in the acute setting to maximize independence and allow return home with family assist and HHPT.    PT Assessment  Patient needs continued PT services    Follow Up Recommendations  Home health PT    Does the patient have the potential to tolerate intense rehabilitation    N/A  Barriers to Discharge  None      Equipment Recommendations  Rolling walker with 5" wheels    Recommendations for Other Services   None  Frequency Min 5X/week    Precautions / Restrictions Precautions Precautions: Fall   Pertinent Vitals/Pain 8/10 with ambulation right hip/groin, left ribs  RN delivered meds after amb      Mobility  Bed Mobility Bed Mobility: Supine to Sit;Sitting - Scoot to Edge of Bed Supine to Sit: 3: Mod assist Sitting - Scoot to Edge of Bed: 4: Min assist Details for Bed Mobility Assistance: assist and cues to scoot hips to edge of bed, assist to lift trunk upright Transfers Transfers: Sit to Stand;Stand to Sit Sit to Stand: 4: Min assist;From bed;With upper extremity assist Stand to Sit: 4: Min assist;With armrests;To chair/3-in-1 Details for Transfer Assistance: guiding assist and cues for technique Ambulation/Gait Ambulation/Gait Assistance: 4: Min assist;5:  Supervision Ambulation Distance (Feet): 50 Feet Assistive device: Rolling walker Ambulation/Gait Assistance Details: increased UE use with right weight bearing Gait Pattern: Antalgic;Step-to pattern;Decreased stride length        PT Diagnosis: Difficulty walking;Acute pain  PT Problem List: Decreased activity tolerance;Decreased mobility;Pain;Decreased knowledge of use of DME;Decreased strength PT Treatment Interventions: DME instruction;Balance training;Gait training;Functional mobility training;Patient/family education;Stair training;Therapeutic activities;Therapeutic exercise     PT Goals(Current goals can be found in the care plan section) Acute Rehab PT Goals Patient Stated Goal: To return to independent PT Goal Formulation: With patient/family Time For Goal Achievement: 02/20/13 Potential to Achieve Goals: Good  Visit Information  Last PT Received On: 02/13/13 Assistance Needed: +1 PT/OT Co-Evaluation/Treatment: Yes History of Present Illness: Patient is a 72 y/o male transferred from [facility] [place]  Presented with left sided rib fractures x 4, right superiro and inferior pubic rami fractures, L2 and L3 transverse process fractures, and left sacral ala fracture.  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: No difficulties Dominant Hand: Right    Cognition  Cognition Arousal/Alertness: Awake/alert Behavior During Therapy: WFL for tasks  assessed/performed Overall Cognitive Status: Within Functional Limits for tasks  assessed    Extremity/Trunk Assessment Upper Extremity Assessment Upper Extremity Assessment: Defer to OT evaluation Lower Extremity Assessment Lower Extremity Assessment: RLE deficits/detail RLE Deficits / Details: AROM limited hip flexion strength and AROM due to pain (about 50 degrees flexion in bed with self assist,) ankle DF WFL, knee extension 4/5 RLE: Unable to fully assess due to pain   Balance Balance Balance Assessed: Yes Static Sitting Balance Static Sitting - Balance Support: Bilateral upper extremity supported Static Sitting - Level of Assistance: 5: Stand by assistance Static Sitting - Comment/# of Minutes: UE use for balance and pain sitting edge of bed  End of Session PT - End of Session Equipment Utilized During Treatment: Gait belt Activity Tolerance: Patient limited by pain Patient left: in chair;with family/visitor present;with call bell/phone within reach Nurse Communication: Patient requests pain meds  GP     [facility] 02/13/2013, 10:57 AM Sheran Lawless, PT [phone] 02/13/2013

## 2013-02-13 NOTE — Evaluation (Signed)
Occupational Therapy Evaluation Patient Details Name: Adam Whitaker MRN: [id number] DOB: 1941/01/07 Today's Date: 02/13/2013 Time: 4098-1191 OT Time Calculation (min): 25 min  OT Assessment / Plan / Recommendation History of present illness Patient is a 72 y/o male transferred from [facility] [place]  Presented with left sided rib fractures x 4, right superiro and inferior pubic rami fractures, L2 and L3 transverse process fractures, and left sacral ala fracture.  H/O HTN and DM   Clinical Impression   Pt demo'ing decreased independence with ADLs and functional mobility due to pain. Pt will benefit from continued acute OT services to address below problem list in prep for return home with wife's assist.    OT Assessment  Patient needs continued OT Services    Follow Up Recommendations  No OT follow up;Supervision/Assistance - 24 hour    Barriers to Discharge      Equipment Recommendations  None recommended by OT (pt's wife to borrow 3n1 from family member)    Recommendations for Other Services    Frequency  Min 2X/week    Precautions / Restrictions Precautions Precautions: Fall   Pertinent Vitals/Pain See vitals    ADL  Eating/Feeding: Performed;Independent Where Assessed - Eating/Feeding: Edge of bed Upper Body Dressing: Performed;Set up Where Assessed - Upper Body Dressing: Unsupported sitting Lower Body Dressing: Performed;Moderate assistance Where Assessed - Lower Body Dressing: Supported sit to stand Toilet Transfer: Mining engineer Method: Sit to Barista:  (bed) Equipment Used: Gait belt;Rolling walker Transfers/Ambulation Related to ADLs: Min guard with RW ambulating in hall and room. ADL Comments: Educated pt and wife on use of 3n1 to elevate toilet and as a shower chair in walk in shower. Wife reports she can borrow a 3n1 from a family member.       OT Diagnosis: Generalized weakness;Acute pain  OT Problem List: Decreased strength;Decreased activity tolerance;Impaired balance (sitting and/or standing);Decreased knowledge of use of DME or AE;Pain OT Treatment Interventions: Self-care/ADL training;DME and/or AE instruction;Therapeutic activities;Patient/family education;Balance training   OT Goals(Current goals can be found in the care plan section) Acute Rehab OT Goals Patient Stated Goal: To return to independent OT Goal Formulation: With patient Time For Goal Achievement: 02/27/13 Potential to Achieve Goals: Good  Visit Information  Last OT Received On: 02/13/13 Assistance Needed: +1 History of Present Illness: Patient is a 72 y/o male transferred from [facility] [place]  Presented with left sided rib fractures x 4, right superiro and inferior pubic rami fractures, L2 and L3 transverse process fractures, and left sacral ala fracture.  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  Cognition  Cognition Arousal/Alertness: Awake/alert Behavior During Therapy: WFL for tasks assessed/performed Overall Cognitive Status: Within Functional Limits for tasks assessed    Extremity/Trunk Assessment Upper Extremity Assessment Upper Extremity Assessment: Overall WFL for tasks assessed (did not fully assess due to rib fxs) Lower Extremity Assessment Lower Extremity Assessment: RLE deficits/detail RLE Deficits / Details: AROM limited hip flexion strength and AROM due to pain (about 50 degrees flexion in bed with self assist,) ankle DF WFL, knee extension 4/5 RLE: Unable to fully assess due to pain     Mobility Bed Mobility Bed Mobility: Supine to Sit;Sitting - Scoot to Edge of Bed Supine to Sit: 3: Mod assist Sitting - Scoot to Edge of Bed: 4: Min assist Details for Bed Mobility Assistance: assist and cues to scoot hips to edge of bed, assist to lift trunk upright Transfers Transfers: Sit to Stand;Stand to Sit Sit to Stand: 4: Min assist;From bed;With upper extremity assist Stand to Sit: 4: Min assist;With armrests;To chair/3-in-1 Details for Transfer Assistance: guiding assist and cues for technique     Exercise     Balance Balance Balance Assessed: Yes Static Sitting Balance Static Sitting - Balance Support: Bilateral upper extremity supported Static Sitting - Level of Assistance: 5: Stand by assistance Static Sitting - Comment/# of Minutes: UE use for balance and pain sitting edge of bed   End of Session OT - End of Session Equipment Utilized During Treatment: Gait belt;Rolling walker Activity Tolerance: Patient tolerated treatment well Patient left: in chair;with call bell/phone within reach;with family/visitor present Nurse Communication: Mobility status;Patient requests pain meds  GO   02/13/2013 Cipriano Mile OTR/L Pager [phone] Office [phone]    Cipriano Mile 02/13/2013, 12:58 PM

## 2015-07-28 ENCOUNTER — Encounter (HOSPITAL_COMMUNITY): Payer: Self-pay

## 2015-07-28 ENCOUNTER — Emergency Department (HOSPITAL_COMMUNITY)
Admission: EM | Admit: 2015-07-28 | Discharge: 2015-07-28 | Disposition: A | Discharge status: Home / Self Care | Payer: Medicare Other | Attending: Emergency Medicine | Admitting: Emergency Medicine

## 2015-07-28 DIAGNOSIS — X500XXA Overexertion from strenuous movement or load, initial encounter: Secondary | ICD-10-CM | POA: Insufficient documentation

## 2015-07-28 DIAGNOSIS — Y9389 Activity, other specified: Secondary | ICD-10-CM | POA: Diagnosis not present

## 2015-07-28 DIAGNOSIS — Y9289 Other specified places as the place of occurrence of the external cause: Secondary | ICD-10-CM | POA: Insufficient documentation

## 2015-07-28 DIAGNOSIS — I129 Hypertensive chronic kidney disease with stage 1 through stage 4 chronic kidney disease, or unspecified chronic kidney disease: Secondary | ICD-10-CM | POA: Insufficient documentation

## 2015-07-28 DIAGNOSIS — S76912A Strain of unspecified muscles, fascia and tendons at thigh level, left thigh, initial encounter: Secondary | ICD-10-CM | POA: Insufficient documentation

## 2015-07-28 DIAGNOSIS — S79922A Unspecified injury of left thigh, initial encounter: Secondary | ICD-10-CM | POA: Diagnosis present

## 2015-07-28 DIAGNOSIS — E119 Type 2 diabetes mellitus without complications: Secondary | ICD-10-CM | POA: Diagnosis not present

## 2015-07-28 DIAGNOSIS — Z87891 Personal history of nicotine dependence: Secondary | ICD-10-CM | POA: Diagnosis not present

## 2015-07-28 DIAGNOSIS — Y990 Civilian activity done for income or pay: Secondary | ICD-10-CM | POA: Diagnosis not present

## 2015-07-28 DIAGNOSIS — Z7984 Long term (current) use of oral hypoglycemic drugs: Secondary | ICD-10-CM | POA: Insufficient documentation

## 2015-07-28 DIAGNOSIS — E785 Hyperlipidemia, unspecified: Secondary | ICD-10-CM | POA: Diagnosis not present

## 2015-07-28 DIAGNOSIS — N189 Chronic kidney disease, unspecified: Secondary | ICD-10-CM | POA: Diagnosis not present

## 2015-07-28 DIAGNOSIS — S76219A Strain of adductor muscle, fascia and tendon of unspecified thigh, initial encounter: Secondary | ICD-10-CM

## 2015-07-28 DIAGNOSIS — Z79899 Other long term (current) drug therapy: Secondary | ICD-10-CM | POA: Diagnosis not present

## 2015-07-28 MED ORDER — CYCLOBENZAPRINE HCL 10 MG PO TABS: 10.0000 mg | ORAL_TABLET | Freq: Two times a day (BID) | ORAL | Status: DC | PRN

## 2015-07-28 MED ORDER — POLYETHYLENE GLYCOL 3350 17 G PO PACK: 17.0000 g | PACK | Freq: Every day | ORAL | Status: DC

## 2015-07-28 MED ORDER — HYDROCODONE-ACETAMINOPHEN 5-325 MG PO TABS
1.0000 | ORAL_TABLET | Freq: Once | ORAL | Status: AC
Start: 2015-07-28 — End: 2015-07-28
  Administered 2015-07-28: 1 via ORAL
  Filled 2015-07-28: qty 1

## 2015-07-28 MED ORDER — HYDROCODONE-ACETAMINOPHEN 5-325 MG PO TABS: 1.0000 | ORAL_TABLET | Freq: Four times a day (QID) | ORAL | Status: DC | PRN

## 2015-07-28 NOTE — ED Notes (Signed)
Pt was getting into truck and pulled self up into truck by grabbing steering wheel, felt a pull in left groin area. Pain decreases w/ rest, worse w/ movement. Tylenol yesterday with mild relief.

## 2015-07-28 NOTE — Discharge Instructions (Signed)
Muscle Strain A muscle strain (pulled muscle) happens when a muscle is stretched beyond normal length. It happens when a sudden, violent force stretches your muscle too far. Usually, a few of the fibers in your muscle are torn. Muscle strain is common in athletes. Recovery usually takes 1-2 weeks. Complete healing takes 5-6 weeks.  HOME CARE   Follow the PRICE method of treatment to help your injury get better. Do this the first 2-3 days after the injury:  Protect. Protect the muscle to keep it from getting injured again.  Rest. Limit your activity and rest the injured body part.  Ice. Put ice in a plastic bag. Place a towel between your skin and the bag. Then, apply the ice and leave it on from 15-20 minutes each hour. After the third day, switch to moist heat packs.  Compression. Use a splint or elastic bandage on the injured area for comfort. Do not put it on too tightly.  Elevate. Keep the injured body part above the level of your heart.  Only take medicine as told by your doctor.  Warm up before doing exercise to prevent future muscle strains. GET HELP IF:   You have more pain or puffiness (swelling) in the injured area.  You feel numbness, tingling, or notice a loss of strength in the injured area. MAKE SURE YOU:   Understand these instructions.  Will watch your condition.  Will get help right away if you are not doing well or get worse.   This information is not intended to replace advice given to you by your health care provider. Make sure you discuss any questions you have with your health care provider.   Take medications as directed. Follow-up with your doctors if not improving in 2-3 days or return here. Work note provided if she needed. The Flexeril will probably make you drowsy made to take that at night.   Document Released: 05/01/2008 Document Revised: 05/13/2013 Document Reviewed: 02/19/2013 Elsevier Interactive Patient Education [employer].

## 2015-07-28 NOTE — ED Notes (Signed)
Pt verbalized understanding of d/c instructions, prescriptions, and follow-up care. No further questions/concerns, VSS, assisted to lobby in wheelchair.  

## 2015-07-28 NOTE — ED Provider Notes (Signed)
CSN: [id number]     Arrival date & time 07/28/15  [id number] History   First MD Initiated Contact with Patient 07/28/15 0915     Chief Complaint  Patient presents with  . Groin Pain     (Consider location/radiation/quality/duration/timing/severity/associated sxs/prior Treatment) Patient is a 74 y.o. male presenting with groin pain. The history is provided by the patient.  Groin Pain Pertinent negatives include no chest pain, no headaches and no shortness of breath.   patient works at a gym, and yesterday while while lifting weights to put him back in the rack felt a twinge in his left groin area. When he went home from mild work or getting up in the truck or increased pain in the left groin area. Today it's even more stiff and sore and very painful to move the left leg. No other injury. No history of similar pain in the past. Patient did have a hernia repair on that side with mesh in the past however is noticed no swelling. Denies any testicular pain. No difficulty urinating no back pain. Does have some numbness on the medial aspect of the proximal thigh. Has significant increase in pain with movement of that leg in the medial upper thigh area.  Past Medical History  Diagnosis Date  . Diabetes mellitus without complication ([place])   . Hyperlipidemia   . Hypertension   . Chronic kidney disease    Past Surgical History  Procedure Laterality Date  . Kidney stone surgery    . Tonsillectomy    . Eye surgery    . Inguinal hernia repair Right 09/11/2012    Procedure: HERNIA REPAIR INGUINAL ADULT;  Surgeon: Imogene Burn. Georgette Dover, MD;  Location: [place];  Service: General;  Laterality: Right;  . Insertion of mesh Right 09/11/2012    Procedure: INSERTION OF MESH;  Surgeon: Imogene Burn. Georgette Dover, MD;  Location: [place];  Service: General;  Laterality: Right;  . Hernia repair     History reviewed. No pertinent family history. Social History  Substance Use Topics  . Smoking status: Former Smoker -- 0.50 packs/day     Types: Cigarettes    Quit date: 09/01/1961  . Smokeless tobacco: None  . Alcohol Use: No    Review of Systems  Constitutional: Negative for fever.  HENT: Negative for congestion.   Eyes: Negative for redness.  Respiratory: Negative for shortness of breath.   Cardiovascular: Negative for chest pain.  Genitourinary: Negative for dysuria, scrotal swelling and testicular pain.  Musculoskeletal: Negative for back pain.  Neurological: Positive for weakness and numbness. Negative for headaches.  Hematological: Does not bruise/bleed easily.  Psychiatric/Behavioral: Negative for confusion.      Allergies  Review of patient's allergies indicates no known allergies.  Home Medications   Prior to Admission medications   Medication Sig Start Date End Date Taking? Authorizing Provider  amLODipine (NORVASC) 5 MG tablet Take 5 mg by mouth at bedtime.    Yes Historical Provider, MD  atorvastatin (LIPITOR) 10 MG tablet Take 10 mg by mouth every morning.    Yes Historical Provider, MD  cholecalciferol (VITAMIN D) 1000 UNITS tablet Take 500 Units by mouth daily.   Yes Historical Provider, MD  metFORMIN (GLUCOPHAGE) 500 MG tablet Take 500-1,000 mg by mouth 2 (two) times daily with a meal. 2 tablets in the morning and 1 tablet in the evening.   Yes Historical Provider, MD  Multiple Vitamins-Minerals (CENTRUM SILVER PO) Take 1 tablet by mouth every morning.   Yes Historical Provider, MD  Omega-3 Fatty Acids (FISH OIL) 1000 MG CAPS Take 1,000 mg by mouth daily.   Yes Historical Provider, MD  cyclobenzaprine (FLEXERIL) 10 MG tablet Take 1 tablet (10 mg total) by mouth 2 (two) times daily as needed for muscle spasms. 07/28/15   Fredia Sorrow, MD  HYDROcodone-acetaminophen (NORCO/VICODIN) 5-325 MG tablet Take 1-2 tablets by mouth every 6 (six) hours as needed for moderate pain. 07/28/15   Fredia Sorrow, MD  polyethylene glycol (MIRALAX / GLYCOLAX) packet Take 17 g by mouth daily. 07/28/15   Fredia Sorrow, MD   BP 151/96 mmHg  Pulse 63  Temp(Src) 97.9 F (36.6 C) (Oral)  Resp 16  Ht 5\' 7"  (1.702 m)  Wt 85.276 kg  BMI 29.44 kg/m2  SpO2 97% Physical Exam  Constitutional: He is oriented to person, place, and time. He appears well-developed and well-nourished. No distress.  HENT:  Head: Normocephalic and atraumatic.  Mouth/Throat: Oropharynx is clear and moist.  Eyes: Conjunctivae and EOM are normal. Pupils are equal, round, and reactive to light.  Neck: Neck supple.  Cardiovascular: Normal rate, regular rhythm and normal heart sounds.   No murmur heard. Pulmonary/Chest: Effort normal and breath sounds normal. No respiratory distress.  Abdominal: Soft. Bowel sounds are normal. There is no tenderness.  Genitourinary: Penis normal.  Uncircumcised male no testicular tenderness. No evidence of hernia no groin bulge. Tenderness to palpation in the proximal medial aspect of the thigh.  Musculoskeletal: Normal range of motion.  Painful to move left thigh.  Neurological: He is alert and oriented to person, place, and time. No cranial nerve deficit. He exhibits normal muscle tone. Coordination normal.  Skin: Skin is warm. No erythema.  Nursing note and vitals reviewed.   ED Course  Procedures (including critical care time) Labs Review Labs Reviewed - No data to display  Imaging Review No results found. I have personally reviewed and evaluated these images and lab results as part of my medical decision-making.   EKG Interpretation None      MDM   Final diagnoses:  Groin strain, initial encounter    Patient clinically with left groin strain. No evidence of any recurrent hernia. Patient did have a hernia repair with mesh in the past in that area. There is no bulge. Hurts more to move the left leg. Will treat the with pain medicine and muscle relaxer and rest.    Fredia Sorrow, MD 07/28/15 1013

## 2016-07-16 DIAGNOSIS — E1122 Type 2 diabetes mellitus with diabetic chronic kidney disease: Secondary | ICD-10-CM | POA: Diagnosis not present

## 2016-07-16 DIAGNOSIS — I10 Essential (primary) hypertension: Secondary | ICD-10-CM | POA: Diagnosis not present

## 2016-07-16 DIAGNOSIS — Z1389 Encounter for screening for other disorder: Secondary | ICD-10-CM | POA: Diagnosis not present

## 2016-07-16 DIAGNOSIS — N200 Calculus of kidney: Secondary | ICD-10-CM | POA: Diagnosis not present

## 2016-07-16 DIAGNOSIS — N400 Enlarged prostate without lower urinary tract symptoms: Secondary | ICD-10-CM | POA: Diagnosis not present

## 2016-07-16 DIAGNOSIS — E7800 Pure hypercholesterolemia, unspecified: Secondary | ICD-10-CM | POA: Diagnosis not present

## 2016-07-16 DIAGNOSIS — Z0000 Encounter for general adult medical examination without abnormal findings: Secondary | ICD-10-CM | POA: Diagnosis not present

## 2016-07-16 DIAGNOSIS — N183 Chronic kidney disease, stage 3 (moderate): Secondary | ICD-10-CM | POA: Diagnosis not present

## 2016-07-16 DIAGNOSIS — Z7984 Long term (current) use of oral hypoglycemic drugs: Secondary | ICD-10-CM | POA: Diagnosis not present

## 2016-07-16 DIAGNOSIS — Z23 Encounter for immunization: Secondary | ICD-10-CM | POA: Diagnosis not present

## 2016-09-12 ENCOUNTER — Other Ambulatory Visit: Payer: Self-pay | Admitting: Gastroenterology

## 2016-10-25 ENCOUNTER — Encounter (HOSPITAL_COMMUNITY): Payer: Self-pay | Admitting: *Deleted

## 2016-10-29 DIAGNOSIS — D124 Benign neoplasm of descending colon: Secondary | ICD-10-CM | POA: Diagnosis not present

## 2016-10-29 DIAGNOSIS — Z1211 Encounter for screening for malignant neoplasm of colon: Secondary | ICD-10-CM | POA: Diagnosis not present

## 2016-10-29 DIAGNOSIS — D120 Benign neoplasm of cecum: Secondary | ICD-10-CM | POA: Diagnosis not present

## 2016-10-29 DIAGNOSIS — Z86010 Personal history of colonic polyps: Secondary | ICD-10-CM | POA: Diagnosis not present

## 2016-10-29 NOTE — Anesthesia Preprocedure Evaluation (Addendum)
Anesthesia Evaluation  Patient identified by MRN, date of birth, ID band Patient awake    Reviewed: Allergy & Precautions, H&P , Patient's Chart, lab work & pertinent test results, reviewed documented beta blocker date and time   Airway Mallampati: II  TM Distance: >3 FB Neck ROM: full    Dental no notable dental hx.    Pulmonary former smoker,    Pulmonary exam normal breath sounds clear to auscultation       Cardiovascular hypertension,  Rhythm:regular Rate:Normal     Neuro/Psych    GI/Hepatic   Endo/Other  diabetes  Renal/GU      Musculoskeletal   Abdominal   Peds  Hematology   Anesthesia Other Findings Hypertension       Diabetes mellitus without complication ([place])         Reproductive/Obstetrics                             Anesthesia Physical Anesthesia Plan  ASA: II  Anesthesia Plan: MAC   Post-op Pain Management:    Induction: Intravenous  Airway Management Planned: Mask and Natural Airway  Additional Equipment:   Intra-op Plan:   Post-operative Plan:   Informed Consent: I have reviewed the patients History and Physical, chart, labs and discussed the procedure including the risks, benefits and alternatives for the proposed anesthesia with the patient or authorized representative who has indicated his/her understanding and acceptance.   Dental Advisory Given  Plan Discussed with: CRNA and Surgeon  Anesthesia Plan Comments:         Anesthesia Quick Evaluation

## 2016-10-30 ENCOUNTER — Encounter (HOSPITAL_COMMUNITY): Payer: Self-pay | Admitting: Anesthesiology

## 2016-10-30 ENCOUNTER — Ambulatory Visit (HOSPITAL_COMMUNITY): Payer: PPO | Admitting: Anesthesiology

## 2016-10-30 ENCOUNTER — Ambulatory Visit (HOSPITAL_COMMUNITY)
Admission: RE | Admit: 2016-10-30 | Discharge: 2016-10-30 | Disposition: A | Discharge status: Home / Self Care | Payer: PPO | Source: Ambulatory Visit | Attending: Gastroenterology | Admitting: Gastroenterology

## 2016-10-30 ENCOUNTER — Encounter (HOSPITAL_COMMUNITY)
Admission: RE | Disposition: A | Discharge status: Home / Self Care | Payer: Self-pay | Source: Ambulatory Visit | Attending: Gastroenterology

## 2016-10-30 DIAGNOSIS — I10 Essential (primary) hypertension: Secondary | ICD-10-CM | POA: Insufficient documentation

## 2016-10-30 DIAGNOSIS — Z7984 Long term (current) use of oral hypoglycemic drugs: Secondary | ICD-10-CM | POA: Diagnosis not present

## 2016-10-30 DIAGNOSIS — E119 Type 2 diabetes mellitus without complications: Secondary | ICD-10-CM | POA: Diagnosis not present

## 2016-10-30 DIAGNOSIS — Z9889 Other specified postprocedural states: Secondary | ICD-10-CM | POA: Insufficient documentation

## 2016-10-30 DIAGNOSIS — H409 Unspecified glaucoma: Secondary | ICD-10-CM | POA: Insufficient documentation

## 2016-10-30 DIAGNOSIS — Z86010 Personal history of colonic polyps: Secondary | ICD-10-CM | POA: Diagnosis not present

## 2016-10-30 DIAGNOSIS — Z9842 Cataract extraction status, left eye: Secondary | ICD-10-CM | POA: Insufficient documentation

## 2016-10-30 DIAGNOSIS — Z9841 Cataract extraction status, right eye: Secondary | ICD-10-CM | POA: Diagnosis not present

## 2016-10-30 DIAGNOSIS — Z87442 Personal history of urinary calculi: Secondary | ICD-10-CM | POA: Insufficient documentation

## 2016-10-30 DIAGNOSIS — Z79899 Other long term (current) drug therapy: Secondary | ICD-10-CM | POA: Insufficient documentation

## 2016-10-30 DIAGNOSIS — Z87891 Personal history of nicotine dependence: Secondary | ICD-10-CM | POA: Insufficient documentation

## 2016-10-30 DIAGNOSIS — Z85828 Personal history of other malignant neoplasm of skin: Secondary | ICD-10-CM | POA: Insufficient documentation

## 2016-10-30 DIAGNOSIS — Z1211 Encounter for screening for malignant neoplasm of colon: Secondary | ICD-10-CM | POA: Diagnosis not present

## 2016-10-30 DIAGNOSIS — D120 Benign neoplasm of cecum: Secondary | ICD-10-CM | POA: Insufficient documentation

## 2016-10-30 DIAGNOSIS — K635 Polyp of colon: Secondary | ICD-10-CM | POA: Diagnosis not present

## 2016-10-30 DIAGNOSIS — K621 Rectal polyp: Secondary | ICD-10-CM | POA: Insufficient documentation

## 2016-10-30 DIAGNOSIS — D124 Benign neoplasm of descending colon: Secondary | ICD-10-CM | POA: Insufficient documentation

## 2016-10-30 DIAGNOSIS — D125 Benign neoplasm of sigmoid colon: Secondary | ICD-10-CM | POA: Insufficient documentation

## 2016-10-30 DIAGNOSIS — D128 Benign neoplasm of rectum: Secondary | ICD-10-CM | POA: Diagnosis not present

## 2016-10-30 HISTORY — DX: Unspecified glaucoma: H40.9

## 2016-10-30 HISTORY — PX: COLONOSCOPY WITH PROPOFOL: SHX5780

## 2016-10-30 HISTORY — DX: Personal history of urinary calculi: Z87.442

## 2016-10-30 HISTORY — DX: Pneumonia, unspecified organism: J18.9

## 2016-10-30 HISTORY — DX: Adverse effect of unspecified anesthetic, initial encounter: T41.45XA

## 2016-10-30 LAB — GLUCOSE, CAPILLARY: GLUCOSE-CAPILLARY: 125 mg/dL — AB (ref 65–99)

## 2016-10-30 SURGERY — COLONOSCOPY WITH PROPOFOL
Anesthesia: Monitor Anesthesia Care

## 2016-10-30 MED ORDER — SODIUM CHLORIDE 0.9 % IV SOLN: INTRAVENOUS | Status: DC

## 2016-10-30 MED ORDER — PROPOFOL 500 MG/50ML IV EMUL
INTRAVENOUS | Status: DC | PRN
  Administered 2016-10-30: 50 mg via INTRAVENOUS

## 2016-10-30 MED ORDER — LACTATED RINGERS IV SOLN
INTRAVENOUS | Status: DC | PRN
  Administered 2016-10-30: 07:00:00 via INTRAVENOUS

## 2016-10-30 MED ORDER — PROPOFOL 10 MG/ML IV BOLUS
INTRAVENOUS | Status: AC
Start: 2016-10-30 — End: 2016-10-30
  Filled 2016-10-30: qty 60

## 2016-10-30 MED ORDER — LACTATED RINGERS IV SOLN
INTRAVENOUS | Status: DC
  Administered 2016-10-30: 1000 mL via INTRAVENOUS

## 2016-10-30 MED ORDER — PROPOFOL 500 MG/50ML IV EMUL
INTRAVENOUS | Status: DC | PRN
  Administered 2016-10-30: 120 ug/kg/min via INTRAVENOUS

## 2016-10-30 SURGICAL SUPPLY — 22 items

## 2016-10-30 NOTE — H&P (Signed)
Procedure: Surveillance colonoscopy. 12/03/2011 colonoscopy was performed with removal of multiple small tubular adenomatous colon polyps.  History: The patient is a 76 year old male born [birth year]. He is scheduled to undergo a repeat surveillance colonoscopy today.  Past medical history: Type 2 diabetes mellitus. Hypertension. Kidney stones requiring lithotripsy. Bilateral cataract surgery. Tonsillectomy. Retinal detachment surgery. Basal cell skin cancers removed. Glaucoma. Left inguinal herniorrhaphy with mesh.  Exam: The patient is alert and lying comfortably on the endoscopy stretcher. Abdomen is soft and nontender to palpation. Lungs are clear to auscultation. Cardiac exam reveals a regular rhythm.  Plan: Proceed with surveillance colonoscopy

## 2016-10-30 NOTE — Op Note (Signed)
[facility] Patient Name: Adam Whitaker Procedure Date: 10/30/2016 MRN: [id number] Attending MD: Garlan Fair , MD Date of Birth: [birth year] CSN: [id number] Age: 76 Admit Type: Outpatient Procedure:                Colonoscopy Indications:              High risk colon cancer surveillance: Personal                            history of non-advanced adenoma Providers:                Garlan Fair, MD, Carmie End, RN,                            William Dalton, Technician Referring MD:              Medicines:                Propofol per Anesthesia Complications:            No immediate complications. Estimated Blood Loss:     Estimated blood loss was minimal. Procedure:                Pre-Anesthesia Assessment:                           - Prior to the procedure, a History and Physical                            was performed, and patient medications and                            allergies were reviewed. The patient's tolerance of                            previous anesthesia was also reviewed. The risks                            and benefits of the procedure and the sedation                            options and risks were discussed with the patient.                            All questions were answered, and informed consent                            was obtained. Prior Anticoagulants: The patient has                            taken aspirin, last dose was day of procedure. ASA                            Grade Assessment: II - A patient with mild systemic  disease. After reviewing the risks and benefits,                            the patient was deemed in satisfactory condition to                            undergo the procedure.                           After obtaining informed consent, the colonoscope                            was passed under direct vision. Throughout the                            procedure, the  patient's blood pressure, pulse, and                            oxygen saturations were monitored continuously. The                            EC-[id number] ([id number]) scope was introduced through                            the anus and advanced to the the cecum, identified                            by appendiceal orifice and ileocecal valve. The                            colonoscopy was performed without difficulty. The                            patient tolerated the procedure well. The quality                            of the bowel preparation was good. The ileocecal                            valve, the appendiceal orifice and the rectum were                            photographed. Scope In: 3:14:97 AM Scope Out: 8:25:18 AM Scope Withdrawal Time: 0 hours 22 minutes 41 seconds  Total Procedure Duration: 0 hours 28 minutes 19 seconds  Findings:      The perianal and digital rectal examinations were normal.      A 5 mm polyp was found in the cecum. The polyp was sessile. The polyp       was removed with a cold snare. Resection and retrieval were complete. An       endoclip was applied to the polypectomy site to stop bleeding.      A 5 mm polyp was found in the descending colon. The polyp was sessile.       The polyp was removed with  a cold snare. Resection and retrieval were       complete.      A 5 mm polyp was found in the sigmoid colon. The polyp was sessile. The       polyp was removed with a cold snare. Resection and retrieval were       complete.      A 4 mm polyp was found in the sigmoid colon. The polyp was sessile. The       polyp was removed with a cold snare. Resection and retrieval were       complete.      A 7 mm polyp was found in the rectum. The polyp was sessile. The polyp       was removed with a hot snare. Resection and retrieval were complete.      The exam was otherwise without abnormality. Impression:               - One 5 mm polyp in the cecum, removed with a cold                             snare. Resected and retrieved.                           - One 5 mm polyp in the descending colon, removed                            with a cold snare. Resected and retrieved.                           - One 5 mm polyp in the sigmoid colon, removed with                            a cold snare. Resected and retrieved.                           - One 4 mm polyp in the sigmoid colon, removed with                            a cold snare. Resected and retrieved.                           - One 7 mm polyp in the rectum, removed with a hot                            snare. Resected and retrieved.                           - The examination was otherwise normal. Moderate Sedation:      N/A- Per Anesthesia Care Recommendation:           - Patient has a contact number available for                            emergencies. The signs and symptoms of potential  delayed complications were discussed with the                            patient. Return to normal activities tomorrow.                            Written discharge instructions were provided to the                            patient.                           - Repeat colonoscopy date to be determined after                            pending pathology results are reviewed for                            surveillance.                           - Resume previous diet.                           - Continue present medications. Procedure Code(s):        --- Professional ---                           [phone], Colonoscopy, flexible; with removal of                            tumor(s), polyp(s), or other lesion(s) by snare                            technique Diagnosis Code(s):        --- Professional ---                           Z86.010, Personal history of colonic polyps                           D12.0, Benign neoplasm of cecum                           D12.4, Benign neoplasm of descending  colon                           D12.5, Benign neoplasm of sigmoid colon                           K62.1, Rectal polyp CPT copyright 2016 American Medical Association. All rights reserved. The codes documented in this report are preliminary and upon coder review may  be revised to meet current compliance requirements. Earle Gell, MD Garlan Fair, MD 10/30/2016 8:34:16 AM This report has been signed electronically. Number of Addenda: 0

## 2016-10-30 NOTE — Transfer of Care (Signed)
Immediate Anesthesia Transfer of Care Note  Patient: Adam Whitaker  Procedure(s) Performed: Procedure(s): COLONOSCOPY WITH PROPOFOL (N/A)  Patient Location: PACU  Anesthesia Type:MAC  Level of Consciousness:  sedated, patient cooperative and responds to stimulation  Airway & Oxygen Therapy:Patient Spontanous Breathing and Patient connected to face mask oxgen  Post-op Assessment:  Report given to PACU RN and Post -op Vital signs reviewed and stable  Post vital signs:  Reviewed and stable  Last Vitals:  Vitals:   10/30/16 0704  BP: (!) 161/72  Pulse: 60  Resp: 15  Temp: 57.9 C    Complications: No apparent anesthesia complications

## 2016-10-30 NOTE — Anesthesia Postprocedure Evaluation (Signed)
Anesthesia Post Note  Patient: Adam Whitaker  Procedure(s) Performed: Procedure(s) (LRB): COLONOSCOPY WITH PROPOFOL (N/A)  Patient location during evaluation: PACU Anesthesia Type: MAC Level of consciousness: awake and alert Pain management: pain level controlled Vital Signs Assessment: post-procedure vital signs reviewed and stable Respiratory status: spontaneous breathing, nonlabored ventilation, respiratory function stable and patient connected to nasal cannula oxygen Cardiovascular status: stable and blood pressure returned to baseline Anesthetic complications: no       Last Vitals:  Vitals:   10/30/16 0850 10/30/16 0900  BP: (!) 102/59 122/73  Pulse: (!) 56   Resp: 15   Temp:      Last Pain:  Vitals:   10/30/16 0838  TempSrc: Oral                 [name]

## 2016-10-30 NOTE — Discharge Instructions (Signed)

## 2016-10-31 ENCOUNTER — Encounter (HOSPITAL_COMMUNITY): Payer: Self-pay | Admitting: Gastroenterology

## 2017-01-16 DIAGNOSIS — I10 Essential (primary) hypertension: Secondary | ICD-10-CM | POA: Diagnosis not present

## 2017-01-16 DIAGNOSIS — N182 Chronic kidney disease, stage 2 (mild): Secondary | ICD-10-CM | POA: Diagnosis not present

## 2017-01-16 DIAGNOSIS — E7800 Pure hypercholesterolemia, unspecified: Secondary | ICD-10-CM | POA: Diagnosis not present

## 2017-01-16 DIAGNOSIS — E1122 Type 2 diabetes mellitus with diabetic chronic kidney disease: Secondary | ICD-10-CM | POA: Diagnosis not present

## 2017-02-15 NOTE — Addendum Note (Signed)
Addendum  created 02/15/17 1348 by Lyndle Herrlich, MD   Sign clinical note

## 2017-02-15 NOTE — Anesthesia Postprocedure Evaluation (Signed)
Anesthesia Post Note  Patient: Adam Whitaker  Procedure(s) Performed: Procedure(s) (LRB): COLONOSCOPY WITH PROPOFOL (N/A)     Anesthesia Post Evaluation  Last Vitals:  Vitals:   10/30/16 0850 10/30/16 0900  BP: (!) 102/59 122/73  Pulse: (!) 56   Resp: 15   Temp:      Last Pain:  Vitals:   10/31/16 1357  TempSrc:   PainSc: 0-No pain                 Layson Bertsch EDWARD

## 2017-07-23 DIAGNOSIS — N182 Chronic kidney disease, stage 2 (mild): Secondary | ICD-10-CM | POA: Diagnosis not present

## 2017-07-23 DIAGNOSIS — E7800 Pure hypercholesterolemia, unspecified: Secondary | ICD-10-CM | POA: Diagnosis not present

## 2017-07-23 DIAGNOSIS — Z1389 Encounter for screening for other disorder: Secondary | ICD-10-CM | POA: Diagnosis not present

## 2017-07-23 DIAGNOSIS — Z7984 Long term (current) use of oral hypoglycemic drugs: Secondary | ICD-10-CM | POA: Diagnosis not present

## 2017-07-23 DIAGNOSIS — Z0000 Encounter for general adult medical examination without abnormal findings: Secondary | ICD-10-CM | POA: Diagnosis not present

## 2017-07-23 DIAGNOSIS — N400 Enlarged prostate without lower urinary tract symptoms: Secondary | ICD-10-CM | POA: Diagnosis not present

## 2017-07-23 DIAGNOSIS — E1122 Type 2 diabetes mellitus with diabetic chronic kidney disease: Secondary | ICD-10-CM | POA: Diagnosis not present

## 2017-07-23 DIAGNOSIS — I10 Essential (primary) hypertension: Secondary | ICD-10-CM | POA: Diagnosis not present

## 2017-09-17 DIAGNOSIS — Z961 Presence of intraocular lens: Secondary | ICD-10-CM | POA: Diagnosis not present

## 2017-09-17 DIAGNOSIS — H52223 Regular astigmatism, bilateral: Secondary | ICD-10-CM | POA: Diagnosis not present

## 2017-10-17 DIAGNOSIS — R972 Elevated prostate specific antigen [PSA]: Secondary | ICD-10-CM | POA: Diagnosis not present

## 2017-11-18 DIAGNOSIS — R3121 Asymptomatic microscopic hematuria: Secondary | ICD-10-CM | POA: Diagnosis not present

## 2017-11-18 DIAGNOSIS — R972 Elevated prostate specific antigen [PSA]: Secondary | ICD-10-CM | POA: Diagnosis not present

## 2018-01-14 DIAGNOSIS — E1122 Type 2 diabetes mellitus with diabetic chronic kidney disease: Secondary | ICD-10-CM | POA: Diagnosis not present

## 2018-01-14 DIAGNOSIS — E7800 Pure hypercholesterolemia, unspecified: Secondary | ICD-10-CM | POA: Diagnosis not present

## 2018-01-14 DIAGNOSIS — I10 Essential (primary) hypertension: Secondary | ICD-10-CM | POA: Diagnosis not present

## 2018-01-14 DIAGNOSIS — N182 Chronic kidney disease, stage 2 (mild): Secondary | ICD-10-CM | POA: Diagnosis not present

## 2018-06-19 DIAGNOSIS — L237 Allergic contact dermatitis due to plants, except food: Secondary | ICD-10-CM | POA: Diagnosis not present

## 2018-06-19 DIAGNOSIS — Z23 Encounter for immunization: Secondary | ICD-10-CM | POA: Diagnosis not present

## 2018-07-15 DIAGNOSIS — H903 Sensorineural hearing loss, bilateral: Secondary | ICD-10-CM | POA: Diagnosis not present

## 2018-07-26 ENCOUNTER — Emergency Department (HOSPITAL_COMMUNITY): Payer: PPO

## 2018-07-26 ENCOUNTER — Emergency Department (HOSPITAL_COMMUNITY)
Admission: EM | Admit: 2018-07-26 | Discharge: 2018-07-26 | Disposition: A | Discharge status: Home / Self Care | Payer: PPO | Attending: Emergency Medicine | Admitting: Emergency Medicine

## 2018-07-26 ENCOUNTER — Other Ambulatory Visit: Payer: Self-pay

## 2018-07-26 ENCOUNTER — Encounter (HOSPITAL_COMMUNITY): Payer: Self-pay | Admitting: Emergency Medicine

## 2018-07-26 DIAGNOSIS — E119 Type 2 diabetes mellitus without complications: Secondary | ICD-10-CM | POA: Insufficient documentation

## 2018-07-26 DIAGNOSIS — N132 Hydronephrosis with renal and ureteral calculous obstruction: Secondary | ICD-10-CM | POA: Diagnosis not present

## 2018-07-26 DIAGNOSIS — Z79899 Other long term (current) drug therapy: Secondary | ICD-10-CM | POA: Diagnosis not present

## 2018-07-26 DIAGNOSIS — E785 Hyperlipidemia, unspecified: Secondary | ICD-10-CM | POA: Diagnosis not present

## 2018-07-26 DIAGNOSIS — Z87891 Personal history of nicotine dependence: Secondary | ICD-10-CM | POA: Diagnosis not present

## 2018-07-26 DIAGNOSIS — Z7984 Long term (current) use of oral hypoglycemic drugs: Secondary | ICD-10-CM | POA: Insufficient documentation

## 2018-07-26 DIAGNOSIS — N200 Calculus of kidney: Secondary | ICD-10-CM | POA: Diagnosis not present

## 2018-07-26 DIAGNOSIS — I10 Essential (primary) hypertension: Secondary | ICD-10-CM | POA: Diagnosis not present

## 2018-07-26 DIAGNOSIS — K8020 Calculus of gallbladder without cholecystitis without obstruction: Secondary | ICD-10-CM | POA: Diagnosis not present

## 2018-07-26 DIAGNOSIS — R1031 Right lower quadrant pain: Secondary | ICD-10-CM | POA: Diagnosis present

## 2018-07-26 LAB — CBC WITH DIFFERENTIAL/PLATELET
ABS IMMATURE GRANULOCYTES: 0.03 10*3/uL (ref 0.00–0.07)
Basophils Absolute: 0 10*3/uL (ref 0.0–0.1)
Basophils Relative: 0 %
Eosinophils Absolute: 0 10*3/uL (ref 0.0–0.5)
Eosinophils Relative: 0 %
HEMATOCRIT: 45.4 % (ref 39.0–52.0)
HEMOGLOBIN: 15.3 g/dL (ref 13.0–17.0)
Immature Granulocytes: 0 %
LYMPHS ABS: 1.3 10*3/uL (ref 0.7–4.0)
LYMPHS PCT: 14 %
MCH: 32.3 pg (ref 26.0–34.0)
MCHC: 33.7 g/dL (ref 30.0–36.0)
MCV: 95.8 fL (ref 80.0–100.0)
MONO ABS: 1.1 10*3/uL — AB (ref 0.1–1.0)
MONOS PCT: 12 %
NEUTROS ABS: 6.6 10*3/uL (ref 1.7–7.7)
Neutrophils Relative %: 74 %
Platelets: 190 10*3/uL (ref 150–400)
RBC: 4.74 MIL/uL (ref 4.22–5.81)
RDW: 12.8 % (ref 11.5–15.5)
WBC: 9.1 10*3/uL (ref 4.0–10.5)
nRBC: 0 % (ref 0.0–0.2)

## 2018-07-26 LAB — URINALYSIS, COMPLETE (UACMP) WITH MICROSCOPIC
BACTERIA UA: NONE SEEN
Bilirubin Urine: NEGATIVE
Ketones, ur: 15 mg/dL — AB
Leukocytes, UA: NEGATIVE
Nitrite: NEGATIVE
PROTEIN: 100 mg/dL — AB
SPECIFIC GRAVITY, URINE: 1.025 (ref 1.005–1.030)
Squamous Epithelial / LPF: NONE SEEN (ref 0–5)
WBC UA: NONE SEEN WBC/hpf (ref 0–5)
pH: 6.5 (ref 5.0–8.0)

## 2018-07-26 LAB — COMPREHENSIVE METABOLIC PANEL
ALK PHOS: 46 U/L (ref 38–126)
ALT: 45 U/L — AB (ref 0–44)
AST: 42 U/L — AB (ref 15–41)
Albumin: 3.7 g/dL (ref 3.5–5.0)
Anion gap: 11 (ref 5–15)
BUN: 18 mg/dL (ref 8–23)
CALCIUM: 9.1 mg/dL (ref 8.9–10.3)
CO2: 27 mmol/L (ref 22–32)
CREATININE: 1.41 mg/dL — AB (ref 0.61–1.24)
Chloride: 98 mmol/L (ref 98–111)
GFR, EST AFRICAN AMERICAN: 56 mL/min — AB (ref >60)
GFR, EST NON AFRICAN AMERICAN: 48 mL/min — AB (ref >60)
Glucose, Bld: 215 mg/dL — ABNORMAL HIGH (ref 70–99)
Potassium: 4.1 mmol/L (ref 3.5–5.1)
Sodium: 136 mmol/L (ref 135–145)
Total Bilirubin: 1 mg/dL (ref 0.3–1.2)
Total Protein: 6.8 g/dL (ref 6.5–8.1)

## 2018-07-26 LAB — LIPASE, BLOOD: LIPASE: 47 U/L (ref 11–51)

## 2018-07-26 MED ORDER — ONDANSETRON HCL 4 MG/2ML IJ SOLN
4.0000 mg | Freq: Once | INTRAMUSCULAR | Status: AC
  Administered 2018-07-26: 4 mg via INTRAVENOUS
  Filled 2018-07-26: qty 2

## 2018-07-26 MED ORDER — TAMSULOSIN HCL 0.4 MG PO CAPS: 0.4000 mg | ORAL_CAPSULE | Freq: Every day | ORAL | 0 refills | Status: AC

## 2018-07-26 MED ORDER — IOHEXOL 300 MG/ML  SOLN
100.0000 mL | Freq: Once | INTRAMUSCULAR | Status: AC | PRN
  Administered 2018-07-26: 80 mL via INTRAVENOUS

## 2018-07-26 MED ORDER — ACETAMINOPHEN 325 MG PO TABS
650.0000 mg | ORAL_TABLET | Freq: Once | ORAL | Status: AC
Start: 2018-07-26 — End: 2018-07-26
  Administered 2018-07-26: 650 mg via ORAL
  Filled 2018-07-26: qty 2

## 2018-07-26 MED ORDER — OXYCODONE-ACETAMINOPHEN 5-325 MG PO TABS
1.0000 | ORAL_TABLET | Freq: Once | ORAL | Status: AC
  Administered 2018-07-26: 1 via ORAL
  Filled 2018-07-26: qty 1

## 2018-07-26 MED ORDER — MORPHINE SULFATE (PF) 4 MG/ML IV SOLN
6.0000 mg | Freq: Once | INTRAVENOUS | Status: AC
  Administered 2018-07-26: 6 mg via INTRAVENOUS
  Filled 2018-07-26: qty 2

## 2018-07-26 MED ORDER — HYDROMORPHONE HCL 1 MG/ML IJ SOLN
1.0000 mg | Freq: Once | INTRAMUSCULAR | Status: AC
  Administered 2018-07-26: 1 mg via INTRAVENOUS
  Filled 2018-07-26: qty 1

## 2018-07-26 MED ORDER — ONDANSETRON 4 MG PO TBDP: 4.0000 mg | ORAL_TABLET | Freq: Three times a day (TID) | ORAL | 0 refills | Status: AC | PRN

## 2018-07-26 MED ORDER — OXYCODONE HCL 5 MG PO TABS: 5.0000 mg | ORAL_TABLET | ORAL | 0 refills | Status: AC | PRN

## 2018-07-26 MED ORDER — SODIUM CHLORIDE 0.9 % IV BOLUS
1000.0000 mL | Freq: Once | INTRAVENOUS | Status: AC
  Administered 2018-07-26: 1000 mL via INTRAVENOUS

## 2018-07-26 NOTE — Discharge Instructions (Signed)
You have a 9 mm right kidney stone.  There is some swelling to the right kidney from this.  Take 500 to 1000 mg of acetaminophen every 6 hours for mild to moderate pain.  Can add oxycodone 5 mg every 4 hours for breakthrough or severe pain.  Oxycodone can cause constipation, hard stools, you should always take a stool softener such as MiraLAX with oxycodone.  Stay well-hydrated.  Use Zofran for nausea.  Flomax is to help dilate your ureter and help the stone pass.    Do not eat or drink anything after midnight on Sunday night.  Call [facility] to be seen Monday morning.  They may recommend further interventions.  Return to the ER for worsening, severe pain despite medicines, fevers, chills, burning with urination, inability to void urine.

## 2018-07-26 NOTE — ED Triage Notes (Signed)
Reports RLQ abdominal pain with vomiting x3 since 2200 last night.  Reports the pain as constant.  States this "feels like a kidney stone".

## 2018-07-26 NOTE — ED Provider Notes (Signed)
[facility] EMERGENCY DEPARTMENT Provider Note   CSN: [id number] Arrival date & time: 07/26/18  6063     History   Chief Complaint Chief Complaint  Patient presents with  . Abdominal Pain    HPI Adam Whitaker is a 77 y.o. male with history of kidney stones requiring lithotripsy and stents, right inguinal hernia repair, diabetes on oral agents, hypertension, left eye glaucoma, is here for evaluation of abdominal pain.  Onset 10 PM last night.  States it feels like a kidney stone.  It has been constant.  Currently 8/10.  Located to the right lower quadrant, nonradiating.  It is worse with palpation.  No interventions or alleviating factors.  Associated with vomiting x3, nonbloody nonbilious.  Denies associated fevers, chills, cough, chest pain, dysuria, urinary frequency, changes to bowel movements, groin pain or bulging, testicular pain.  Has noticed his urine is a little bit darker, light brown.  Last BM was yesterday morning.  He typically goes every other day without having a bowel movement, he is still passing gas.  HPI  Past Medical History:  Diagnosis Date  . Complication of anesthesia 09/11/2012   left side swelled up after needle insertion after surgery, area did heal  . Diabetes mellitus without complication ([place])    type 2  . Glaucoma    left eye  . History of kidney stones   . Hyperlipidemia   . Hypertension   . Pneumonia 15 yrs ago   pneumococcal     Patient Active Problem List   Diagnosis Date Noted  . Type II or unspecified type diabetes mellitus without mention of complication, not stated as uncontrolled 02/12/2013  . Fracture of multiple ribs 02/12/2013  . Fracture of transverse process of spine without spinal cord lesion 02/12/2013  . Sacral fracture, closed ([place]) 02/12/2013  . Closed fracture of multiple pubic rami ([place]) 02/12/2013  . Right inguinal hernia 08/18/2012    Past Surgical History:  Procedure Laterality Date  .  COLONOSCOPY WITH PROPOFOL N/A 10/30/2016   Procedure: COLONOSCOPY WITH PROPOFOL;  Surgeon: Garlan Fair, MD;  Location: [facility];  Service: Endoscopy;  Laterality: N/A;  . ewsl    . EYE SURGERY Bilateral    cataract both eyes, detached retina both eyes   . HERNIA REPAIR    . INGUINAL HERNIA REPAIR Right 09/11/2012   Procedure: HERNIA REPAIR INGUINAL ADULT;  Surgeon: Imogene Burn. Georgette Dover, MD;  Location: [facility];  Service: General;  Laterality: Right;  . INSERTION OF MESH Right 09/11/2012   Procedure: INSERTION OF MESH;  Surgeon: Imogene Burn. Georgette Dover, MD;  Location: [facility];  Service: General;  Laterality: Right;  . KIDNEY STONE SURGERY     cystoscopy  . TONSILLECTOMY          Home Medications    Prior to Admission medications   Medication Sig Start Date End Date Taking? Authorizing Provider  amLODipine (NORVASC) 5 MG tablet Take 5 mg by mouth every morning.    Yes [provider]  atorvastatin (LIPITOR) 10 MG tablet Take 10 mg by mouth every morning.    Yes [provider]  cholecalciferol (VITAMIN D) 1000 UNITS tablet Take 1,000 Units by mouth daily.    Yes [provider]  metFORMIN (GLUCOPHAGE) 500 MG tablet Take 500-1,000 mg by mouth 2 (two) times daily with a meal. 2 tablets in the morning and 1 tablet in the evening.   Yes [provider]  Multiple Vitamins-Minerals (CENTRUM SILVER PO) Take  1 tablet by mouth every morning.   Yes [provider]  Omega-3 Fatty Acids (FISH OIL) 1000 MG CAPS Take 1,000 mg by mouth daily.   Yes [provider]  ondansetron (ZOFRAN ODT) 4 MG disintegrating tablet Take 1 tablet (4 mg total) by mouth every 8 (eight) hours as needed for nausea or vomiting. 07/26/18   Kinnie Feil, PA-C  oxyCODONE (OXY IR/ROXICODONE) 5 MG immediate release tablet Take 1 tablet (5 mg total) by mouth every 4 (four) hours as needed for up to 3 days for severe pain. 07/26/18 07/29/18  Kinnie Feil, PA-C  tamsulosin  (FLOMAX) 0.4 MG CAPS capsule Take 1 capsule (0.4 mg total) by mouth daily. 07/26/18 08/25/18  Kinnie Feil, PA-C    Family History History reviewed. No pertinent family history.  Social History Social History   Tobacco Use  . Smoking status: Former Smoker    Packs/day: 0.50    Types: Cigarettes    Last attempt to quit: 09/01/1961    Years since quitting: 56.9  . Smokeless tobacco: Never Used  Substance Use Topics  . Alcohol use: Yes    Comment: rare  . Drug use: No     Allergies   Patient has no known allergies.   Review of Systems Review of Systems  Gastrointestinal: Positive for abdominal pain and vomiting.  All other systems reviewed and are negative.    Physical Exam Updated Vital Signs BP 93/63 (BP Location: Left Arm)   Pulse (!) 54   Temp 97.6 F (36.4 C) (Oral)   Resp 16   Ht 5\' 7"  (1.702 m)   Wt 74.8 kg   SpO2 96%   BMI 25.84 kg/m   Physical Exam Vitals signs and nursing note reviewed.  Constitutional:      Appearance: He is well-developed.     Comments: Non toxic.  HENT:     Head: Normocephalic and atraumatic.     Nose: Nose normal.  Eyes:     Conjunctiva/sclera: Conjunctivae normal.     Pupils: Pupils are equal, round, and reactive to light.  Neck:     Musculoskeletal: Normal range of motion.  Cardiovascular:     Rate and Rhythm: Normal rate and regular rhythm.     Heart sounds: Normal heart sounds.     Comments: 1+ radial and PT pulses bilaterally Pulmonary:     Effort: Pulmonary effort is normal.     Breath sounds: Normal breath sounds.  Abdominal:     General: Bowel sounds are normal.     Palpations: Abdomen is soft.     Tenderness: There is abdominal tenderness in the right lower quadrant. Positive signs include McBurney's sign.     Comments: Mild guarding with palpation at RLQ/McBurney's. No suprapubic or CVA tenderness. Negative Murphy's. No rash to abdomen. No distention.   Genitourinary:    Comments: No bulging or  tenderness to inguinal creases bilaterally. Musculoskeletal: Normal range of motion.  Skin:    General: Skin is warm and dry.     Capillary Refill: Capillary refill takes less than 2 seconds.  Neurological:     Mental Status: He is alert and oriented to person, place, and time.  Psychiatric:        Behavior: Behavior normal.        Thought Content: Thought content normal.        Judgment: Judgment normal.      ED Treatments / Results  Labs (all labs ordered are listed, but only  abnormal results are displayed) Labs Reviewed  CBC WITH DIFFERENTIAL/PLATELET - Abnormal; Notable for the following components:      Result Value   Monocytes Absolute 1.1 (*)    All other components within normal limits  COMPREHENSIVE METABOLIC PANEL - Abnormal; Notable for the following components:   Glucose, Bld 215 (*)    Creatinine, Ser 1.41 (*)    AST 42 (*)    ALT 45 (*)    GFR calc non Af Amer 48 (*)    GFR calc Af Amer 56 (*)    All other components within normal limits  URINALYSIS, COMPLETE (UACMP) WITH MICROSCOPIC - Abnormal; Notable for the following components:   Color, Urine YELLOW (*)    APPearance CLEAR (*)    Glucose, UA >1000 (*)    Hgb urine dipstick LARGE (*)    Ketones, ur 15 (*)    Protein, ur 100 (*)    All other components within normal limits  LIPASE, BLOOD    EKG None  Radiology Ct Abdomen Pelvis W Contrast  Result Date: 07/26/2018 CLINICAL DATA:  Right lower quadrant pain with vomiting since last night history of nephrolithiasis EXAM: CT ABDOMEN AND PELVIS WITH CONTRAST TECHNIQUE: Multidetector CT imaging of the abdomen and pelvis was performed using the standard protocol following bolus administration of intravenous contrast. CONTRAST:  62mL OMNIPAQUE IOHEXOL 300 MG/ML  SOLN COMPARISON:  None. FINDINGS: Lower chest: No acute abnormality. Hepatobiliary: Marked hepatic hypoattenuation compatible with hepatic steatosis. No focal hepatic abnormality or biliary dilatation.  Hepatic and portal veins are patent. Small gallstones noted. Gallbladder is nondistended. Common bile duct nondilated. Pancreas: Unremarkable. No pancreatic ductal dilatation or surrounding inflammatory changes. Spleen: Normal in size without focal abnormality. Adrenals/Urinary Tract: Normal adrenal glands. Right kidney demonstrates mild hydronephrosis secondary to an obstructing proximal right ureteral 9 mm calculus, image 38 series 3. This is confirmed on the sagittal and coronal reconstructions. Additional punctate nonobstructing right upper and lower pole calculi noted. Midpole right posterior renal cyst measures 2.8 cm. Mild perinephric strandy edema about the right kidney. This extends along the right retroperitoneum. Left kidney and ureter demonstrate no acute process or obstruction. Left ureter nondilated. Bladder is underdistended and has mild nonspecific wall thickening. Stomach/Bowel: Negative for bowel obstruction, significant dilatation, ileus, free air. Moderate distension of the cecum. Appendix not visualized. No fluid collection or abscess. No free fluid or ascites. Vascular/Lymphatic: Aortic atherosclerosis noted. No significant aneurysm or occlusive process. No retroperitoneal hemorrhage or hematoma. Mesenteric and renal vasculature remain patent. No adenopathy. Reproductive: Prostate gland is enlarged. Symmetric seminal vesicles. No other significant finding by CT. Other: Small fat containing inguinal hernias bilaterally. Intact abdominal wall. No ascites. Musculoskeletal: Degenerative changes of the spine. No acute osseous finding. IMPRESSION: 9 mm mildly obstructing proximal right ureteral calculus with associated mild right hydronephrosis and perinephric inflammation. Additional punctate nonobstructing right nephrolithiasis Cholelithiasis Hepatic steatosis Abdominal atherosclerosis Electronically Signed   By: Jerilynn Mages.  Shick M.D.   On: 07/26/2018 10:34    Procedures Procedures (including critical  care time)  Medications Ordered in ED Medications  HYDROmorphone (DILAUDID) injection 1 mg (1 mg Intravenous Given 07/26/18 0828)  ondansetron (ZOFRAN) injection 4 mg (4 mg Intravenous Given 07/26/18 0827)  sodium chloride 0.9 % bolus 1,000 mL (0 mLs Intravenous Stopped 07/26/18 1135)  iohexol (OMNIPAQUE) 300 MG/ML solution 100 mL (80 mLs Intravenous Contrast Given 07/26/18 0958)  morphine 4 MG/ML injection 6 mg (6 mg Intravenous Given 07/26/18 1039)  oxyCODONE-acetaminophen (PERCOCET/ROXICET) 5-325 MG per tablet 1  tablet (1 tablet Oral Given 07/26/18 1136)  acetaminophen (TYLENOL) tablet 650 mg (650 mg Oral Given 07/26/18 1136)     Initial Impression / Assessment and Plan / ED Course  I have reviewed the triage vital signs and the nursing notes.  Pertinent labs & imaging results that were available during my care of the patient were reviewed by me and considered in my medical decision making (see chart for details).  Clinical Course as of Jul 26 1305  Sat Jul 26, 2018  0901 Hgb urine dipstick(!): LARGE [CG]  0902 Protein(!): 100 [CG]  1048 IMPRESSION: 9 mm mildly obstructing proximal right ureteral calculus with associated mild right hydronephrosis and perinephric inflammation.  Additional punctate nonobstructing right nephrolithiasis  Cholelithiasis Hepatic steatosis Abdominal atherosclerosis  Electronically Signed By: Jerilynn Mages. Shick M.D. On: 07/26/2018 10:34  CT ABDOMEN PELVIS W CONTRAST [CG]  1124 Spoke to Dr. Gilford Rile.  He recommends symptom control.  If successful, recommends discharge with follow-up in the office on Monday.  He may need urgent intervention.  Discussed plan with patient and wife who are hoping they can be discharged today.   [CG]  1255 Creatinine(!): 1.41 [CG]    Clinical Course User Index [CG] Kinnie Feil, PA-C    DDX includes kidney stone versus pyelonephritis/UTI versus appendicitis versus right-sided diverticulitis.  He has history of  abdominal surgery once however SBO less likely as he has no changes to BMs.  Dissection is less likely, he is normotensive, distal pulses are symmetric, belly exam is reassuring.  We will obtain lab work, urinalysis provide symptom control and reassess.  I anticipate he will need imaging CT renal versus CT A/P.  1255: CT A/P confirms 9 mm mildly obstructive right renal stone with mild hydro-.  Creatinine 1.41.  No leukocytosis.  No signs of infection in the urine.  I spoke to urology who has reviewed patient's chart.  They recommend pain control in the ER and if successful, appropriate for discharge with follow-up Monday morning.  This was discussed with patient.  He is eager to go home and would rather follow-up as outpatient for this.  Symptoms eventually well controlled down to 4/10 no recurrence of emesis.  Tolerating p.o.  He is voiding urine.  He was discharged in good condition with Flomax, symptomatic management.  Instructed patient to be n.p.o. starting Sunday night as instructed by Dr. Winters.  Return precautions given.  Final Clinical Impressions(s) / ED Diagnoses   Final diagnoses:  Right renal stone    ED Discharge Orders         Ordered    oxyCODONE (OXY IR/ROXICODONE) 5 MG immediate release tablet  Every 4 hours PRN     07/26/18 1241    tamsulosin (FLOMAX) 0.4 MG CAPS capsule  Daily     07/26/18 1241    ondansetron (ZOFRAN ODT) 4 MG disintegrating tablet  Every 8 hours PRN     12 /21/19 [place], [place], PA-C 07/26/18 [place], [place], DO 07/26/18 1658

## 2018-07-26 NOTE — ED Notes (Signed)
Patient transported to CT 

## 2018-07-26 NOTE — ED Provider Notes (Signed)
Medical screening examination/treatment/procedure(s) were conducted as a shared visit with non-physician practitioner(s) and myself.  I personally evaluated the patient during the encounter. Briefly, the patient is a 77 y.o. male with history of kidney stones who presents to the ED with right-sided abdominal pain.  Patient pain for the last several hours, day.  Normal vitals.  No fever.  Feels like prior kidney stones.  Tender in the right CVA area, right lower quadrant.  Lab work showed no significant anemia, electrolyte abnormality, kidney injury.  Urinalysis is no sign of infection.  CT scan showed 9 mm stone.  Patient was given 2 rounds IV pain medicine, IV fluids.  Given oral pain medicine.  Talked with urology. Dr. Gilford Rile on the phone and as long his pain is controlled patient can follow-up on Monday.  Patient feels like his pain is controlled.  He would like to follow-up on Monday.  Was given return precautions.  Discharged in good condition.    This chart was dictated using voice recognition software.  Despite best efforts to proofread,  errors can occur which can change the documentation meaning.    EKG Interpretation None           Lennice Sites, DO 07/26/18 1247

## 2018-07-26 NOTE — ED Notes (Signed)
ED Provider at bedside. 

## 2018-07-28 ENCOUNTER — Encounter (HOSPITAL_COMMUNITY)
Admission: RE | Disposition: A | Discharge status: Home / Self Care | Payer: Self-pay | Source: Other Acute Inpatient Hospital | Attending: Urology

## 2018-07-28 ENCOUNTER — Other Ambulatory Visit: Payer: Self-pay | Admitting: Urology

## 2018-07-28 ENCOUNTER — Encounter (HOSPITAL_COMMUNITY): Payer: Self-pay | Admitting: General Practice

## 2018-07-28 ENCOUNTER — Ambulatory Visit (HOSPITAL_COMMUNITY)
Admission: RE | Admit: 2018-07-28 | Discharge: 2018-07-28 | Disposition: A | Discharge status: Home / Self Care | Payer: PPO | Source: Other Acute Inpatient Hospital | Attending: Urology | Admitting: Urology

## 2018-07-28 ENCOUNTER — Ambulatory Visit (HOSPITAL_COMMUNITY): Payer: PPO

## 2018-07-28 DIAGNOSIS — I491 Atrial premature depolarization: Secondary | ICD-10-CM | POA: Insufficient documentation

## 2018-07-28 DIAGNOSIS — N201 Calculus of ureter: Secondary | ICD-10-CM

## 2018-07-28 DIAGNOSIS — E119 Type 2 diabetes mellitus without complications: Secondary | ICD-10-CM | POA: Diagnosis not present

## 2018-07-28 DIAGNOSIS — H409 Unspecified glaucoma: Secondary | ICD-10-CM | POA: Diagnosis not present

## 2018-07-28 DIAGNOSIS — Z7984 Long term (current) use of oral hypoglycemic drugs: Secondary | ICD-10-CM | POA: Diagnosis not present

## 2018-07-28 DIAGNOSIS — N200 Calculus of kidney: Secondary | ICD-10-CM | POA: Diagnosis not present

## 2018-07-28 DIAGNOSIS — Z7982 Long term (current) use of aspirin: Secondary | ICD-10-CM | POA: Insufficient documentation

## 2018-07-28 DIAGNOSIS — Z79899 Other long term (current) drug therapy: Secondary | ICD-10-CM | POA: Insufficient documentation

## 2018-07-28 DIAGNOSIS — N202 Calculus of kidney with calculus of ureter: Secondary | ICD-10-CM | POA: Insufficient documentation

## 2018-07-28 HISTORY — PX: EXTRACORPOREAL SHOCK WAVE LITHOTRIPSY: SHX1557

## 2018-07-28 LAB — GLUCOSE, CAPILLARY: Glucose-Capillary: 156 mg/dL — ABNORMAL HIGH (ref 70–99)

## 2018-07-28 SURGERY — LITHOTRIPSY, ESWL
Anesthesia: General

## 2018-07-28 MED ORDER — SODIUM CHLORIDE 0.9 % IV SOLN
INTRAVENOUS | Status: DC
  Administered 2018-07-28: 11:00:00 via INTRAVENOUS

## 2018-07-28 MED ORDER — DIPHENHYDRAMINE HCL 25 MG PO CAPS
25.0000 mg | ORAL_CAPSULE | ORAL | Status: AC
  Administered 2018-07-28: 25 mg via ORAL
  Filled 2018-07-28: qty 1

## 2018-07-28 MED ORDER — OXYCODONE-ACETAMINOPHEN 5-325 MG PO TABS: 1.0000 | ORAL_TABLET | Freq: Four times a day (QID) | ORAL | 0 refills | Status: AC | PRN

## 2018-07-28 MED ORDER — DIAZEPAM 5 MG PO TABS
10.0000 mg | ORAL_TABLET | ORAL | Status: AC
  Administered 2018-07-28: 10 mg via ORAL
  Filled 2018-07-28 (×2): qty 2

## 2018-07-28 MED ORDER — CIPROFLOXACIN HCL 500 MG PO TABS
500.0000 mg | ORAL_TABLET | ORAL | Status: AC
  Administered 2018-07-28: 500 mg via ORAL
  Filled 2018-07-28: qty 1

## 2018-07-28 NOTE — Progress Notes (Signed)
Follow up EKG after lithotripsy  Shows PVCs . Called Dr Junious Silk to inform him. Okay to discharge patient.

## 2018-07-28 NOTE — Interval H&P Note (Signed)
History and Physical Interval Note:  07/28/2018 11:39 AM  Adam Whitaker  has presented today for surgery, with the diagnosis of right proximal stone  The various methods of treatment have been discussed with the patient and family. After consideration of risks, benefits and other options for treatment, the patient has consented to  Procedure(s): EXTRACORPOREAL SHOCK WAVE LITHOTRIPSY (ESWL) (N/A) as a surgical intervention. Adam Whitaker spoke with pt's wife and I was listening. Pt last took ASA Friday morning. He vomited after taking his meds as well.  The patient's history has been reviewed, patient examined, no change in status, stable for surgery.  I have reviewed the patient's chart and labs.  Questions were answered to the patient's satisfaction.  He elects to proceed.    Festus Aloe

## 2018-07-28 NOTE — Discharge Instructions (Signed)
Lithotripsy, Care After °This sheet gives you information about how to care for yourself after your procedure. Your health care provider may also give you more specific instructions. If you have problems or questions, contact your health care provider. °What can I expect after the procedure? °After the procedure, it is common to have: °· Some blood in your urine. This should only last for a few days. °· Soreness in your back, sides, or upper abdomen for a few days. °· Blotches or bruises on your back where the pressure wave entered the skin. °· Pain, discomfort, or nausea when pieces (fragments) of the kidney stone move through the tube that carries urine from the kidney to the bladder (ureter). Stone fragments may pass soon after the procedure, but they may continue to pass for up to 4-8 weeks. °? If you have severe pain or nausea, contact your health care provider. This may be caused by a large stone that was not broken up, and this may mean that you need more treatment. °· Some pain or discomfort during urination. °· Some pain or discomfort in the lower abdomen or (in men) at the base of the penis. °Follow these instructions at home: °Medicines °· Take over-the-counter and prescription medicines only as told by your health care provider. °· If you were prescribed an antibiotic medicine, take it as told by your health care provider. Do not stop taking the antibiotic even if you start to feel better. °· Do not drive for 24 hours if you were given a medicine to help you relax (sedative). °· Do not drive or use heavy machinery while taking prescription pain medicine. °Eating and drinking ° °  ° °· Drink enough water and fluids to keep your urine clear or pale yellow. This helps any remaining pieces of the stone to pass. It can also help prevent new stones from forming. °· Eat plenty of fresh fruits and vegetables. °· Follow instructions from your health care provider about eating and drinking restrictions. You may be  instructed: °? To reduce how much salt (sodium) you eat or drink. Check ingredients and nutrition facts on packaged foods and beverages. °? To reduce how much meat you eat. °· Eat the recommended amount of calcium for your age and gender. Ask your health care provider how much calcium you should have. °General instructions °· Get plenty of rest. °· Most people can resume normal activities 1-2 days after the procedure. Ask your health care provider what activities are safe for you. °· Your health care provider may direct you to lie in a certain position (postural drainage) and tap firmly (percuss) over your kidney area to help stone fragments pass. Follow instructions as told by your health care provider. °· If directed, strain all urine through the strainer that was provided by your health care provider. °? Keep all fragments for your health care provider to see. Any stones that are found may be sent to a medical lab for examination. The stone may be as small as a grain of salt. °· Keep all follow-up visits as told by your health care provider. This is important. °Contact a health care provider if: °· You have pain that is severe or does not get better with medicine. °· You have nausea that is severe or does not go away. °· You have blood in your urine longer than your health care provider told you to expect. °· You have more blood in your urine. °· You have pain during urination that does   not go away. °· You urinate more frequently than usual and this does not go away. °· You develop a rash or any other possible signs of an allergic reaction. °Get help right away if: °· You have severe pain in your back, sides, or upper abdomen. °· You have severe pain while urinating. °· Your urine is very dark red. °· You have blood in your stool (feces). °· You cannot pass any urine at all. °· You feel a strong urge to urinate after emptying your bladder. °· You have a fever or chills. °· You develop shortness of breath,  difficulty breathing, or chest pain. °· You have severe nausea that leads to persistent vomiting. °· You faint. °Summary °· After this procedure, it is common to have some pain, discomfort, or nausea when pieces (fragments) of the kidney stone move through the tube that carries urine from the kidney to the bladder (ureter). If this pain or nausea is severe, however, you should contact your health care provider. °· Most people can resume normal activities 1-2 days after the procedure. Ask your health care provider what activities are safe for you. °· Drink enough water and fluids to keep your urine clear or pale yellow. This helps any remaining pieces of the stone to pass, and it can help prevent new stones from forming. °· If directed, strain your urine and keep all fragments for your health care provider to see. Fragments or stones may be as small as a grain of salt. °· Get help right away if you have severe pain in your back, sides, or upper abdomen or have severe pain while urinating. °This information is not intended to replace advice given to you by your health care provider. Make sure you discuss any questions you have with your health care provider. °Document Released: 08/12/2007 Document Revised: 01/01/2018 Document Reviewed: 06/13/2016 °Elsevier Interactive Patient Education © 2019 [employer] ° ° °Moderate Conscious Sedation, Adult, Care After °These instructions provide you with information about caring for yourself after your procedure. Your health care provider may also give you more specific instructions. Your treatment has been planned according to current medical practices, but problems sometimes occur. Call your health care provider if you have any problems or questions after your procedure. °What can I expect after the procedure? °After your procedure, it is common: °· To feel sleepy for several hours. °· To feel clumsy and have poor balance for several hours. °· To have poor judgment for several  hours. °· To vomit if you eat too soon. °Follow these instructions at home: °For at least 24 hours after the procedure: ° °· Do not: °? Participate in activities where you could fall or become injured. °? Drive. °? Use heavy machinery. °? Drink alcohol. °? Take sleeping pills or medicines that cause drowsiness. °? Make important decisions or sign legal documents. °? Take care of children on your own. °· Rest. °Eating and drinking °· Follow the diet recommended by your health care provider. °· If you vomit: °? Drink water, juice, or soup when you can drink without vomiting. °? Make sure you have little or no nausea before eating solid foods. °General instructions °· Have a responsible adult stay with you until you are awake and alert. °· Take over-the-counter and prescription medicines only as told by your health care provider. °· If you smoke, do not smoke without supervision. °· Keep all follow-up visits as told by your health care provider. This is important. °Contact a health care provider   if: °· You keep feeling nauseous or you keep vomiting. °· You feel light-headed. °· You develop a rash. °· You have a fever. °Get help right away if: °· You have trouble breathing. °This information is not intended to replace advice given to you by your health care provider. Make sure you discuss any questions you have with your health care provider. °Document Released: 05/13/2013 Document Revised: 12/26/2015 Document Reviewed: 11/12/2015 °Elsevier Interactive Patient Education © 2019 [employer] ° ° °

## 2018-07-28 NOTE — Op Note (Signed)
Pre-operative diagnosis: right proximal stone Post-operative diagnosis: right proximal stone  Procedure: right ESWL   Findings: Right proximal stone - stone faded and fragmented well, but he may need a staged procedure if he does not pass the fragments/stone. He developed PAC's and was gated. He remained stable. EKG ordered for PACU.

## 2018-07-28 NOTE — H&P (Signed)
Office Visit Report     07/28/2018   --------------------------------------------------------------------------------   Adam Whitaker  MRN: [id number]  PRIMARY CARE:  Wenda Low, MD  DOB: [birth year], 77 year old Male  REFERRING:  Wenda Low, MD  SSN: [id number]  PROVIDER:  Ellison Hughs, M.D.    TREATING:  Louis Meckel, M.D.    LOCATION:  [facility] [phone]   --------------------------------------------------------------------------------   CC: f/u for obstructing stone  HPI: Adam Whitaker is a 77 year-old male patient who was referred by Dr. Wenda Low, MD who is here for further eval and management of an obstructing stone.  The patient's stone is on his right side. The stone was 75mm right proximal stone. There are additional stones within the urinary tract. They are located proximal right ureteral.   The patient has not passed their stone since his visit. The patient is complaining of nausea, vomiting, and flank pain. The patient underwent CT scan prior to today's appointment. The patient has been taking tamsulosin for their obstructing stone.   Patient presented to [facility] 07/26/18 for right sided flank/abdominal pain, nausea, vomiting, and gross hematuria. CT stone study showed 9 mm mildly obstructing proximal rt ureteral calculus with mild right hydro/ perinephric inflammation. History of stones 10 years ago treated with lithotripsy.     ALLERGIES: No Allergies    MEDICATIONS: Aspirin  Metformin Hcl 500 mg tablet  Tamsulosin Hcl 0.4 mg capsule  Amlodipine Besylate  Atorvastatin Calcium  Fish Oil  Miralax 17 gram powder in packet  Multivitamin  Oxycodone Hcl Er  Vitamin D2  Zofran     GU PSH: None     PSH Notes: Eye Surgery   NON-GU PSH: None   GU PMH: Elevated PSA - 11/18/2017 Microscopic hematuria - 11/18/2017 Nocturia, Nocturia - 2014 Other microscopic hematuria, Microscopic hematuria - 2014      PMH Notes:   1898-08-06 00:00:00 - Note: Normal Routine History And Physical Senior Citizen [phone])  2007-03-14 10:18:10 - Note: Nephrolithiasis Of Both Kidneys  2007-03-14 10:18:10 - Note: Hematuria   NON-GU PMH: Personal history of other endocrine, nutritional and metabolic disease, History of diabetes mellitus - 2014 Personal history of other specified conditions, History of urinary frequency - 2014 Diabetes Type 2 Glaucoma    FAMILY HISTORY: Death In The Family Father - Runs In Family Death In The Family Mother - Runs In Family Family Health Status Number - Runs In Family   SOCIAL HISTORY: Marital Status: Married Preferred Language: English; Ethnicity: Not Hispanic Or Latino; Race: White Current Smoking Status: Patient has never smoked.   Tobacco Use Assessment Completed: Used Tobacco in last 30 days? Does not use smokeless tobacco. Drinks 1 drink per week.  Does not use drugs. Does not drink caffeine. Has not had a blood transfusion.     Notes: Tobacco Use, Marital History - Currently Married, Occupation:   REVIEW OF SYSTEMS:    GU Review Male:   Patient reports burning/ pain with urination. Patient denies frequent urination, hard to postpone urination, get up at night to urinate, leakage of urine, stream starts and stops, trouble starting your stream, have to strain to urinate , erection problems, and penile pain.  Gastrointestinal (Upper):   Patient reports nausea and vomiting. Patient denies indigestion/ heartburn.  Gastrointestinal (Lower):   Patient denies diarrhea and constipation.  Constitutional:   Patient denies fever, night sweats, weight loss, and fatigue.  Skin:   Patient denies skin rash/ lesion and itching.  Eyes:  Patient denies double vision and blurred vision.  Ears/ Nose/ Throat:   Patient denies sore throat and sinus problems.  Hematologic/Lymphatic:   Patient denies swollen glands and easy bruising.  Cardiovascular:   Patient denies leg swelling and chest pains.   Respiratory:   Patient denies cough and shortness of breath.  Endocrine:   Patient denies excessive thirst.  Musculoskeletal:   Patient reports back pain. Patient denies joint pain.  Neurological:   Patient denies headaches and dizziness.  Psychologic:   Patient denies depression and anxiety.   VITAL SIGNS:      07/28/2018 08:54 AM  Weight 189 lb / 85.73 kg  Height 67 in / 170.18 cm  BP 160/82 mmHg  Pulse 84 /min  Temperature 97.7 F / 36.5 C  BMI 29.6 kg/m   MULTI-SYSTEM PHYSICAL EXAMINATION:    Constitutional: Well-nourished. No physical deformities. Normally developed. Good grooming. Acute distress  Neck: Neck symmetrical, not swollen. Normal tracheal position.  Respiratory: Normal breath sounds. No labored breathing, no use of accessory muscles.   Cardiovascular: Regular rate and rhythm. No murmur, no gallop. Normal temperature, normal extremity pulses, no swelling, no varicosities.   Lymphatic: No enlargement of neck, axillae, groin.  Skin: No paleness, no jaundice, no cyanosis. No lesion, no ulcer, no rash.  Neurologic / Psychiatric: Oriented to time, oriented to place, oriented to person. No depression, no anxiety, no agitation.  Gastrointestinal: No mass, no tenderness, no rigidity, non obese abdomen.  Eyes: Normal conjunctivae. Normal eyelids.  Ears, Nose, Mouth, and Throat: Left ear no scars, no lesions, no masses. Right ear no scars, no lesions, no masses. Nose no scars, no lesions, no masses. Normal hearing. Normal lips.  Musculoskeletal: Normal gait and station of head and neck.     PAST DATA REVIEWED:  Source Of History:  Patient   PROCEDURES:          Urinalysis w/Scope Dipstick Dipstick Cont'd Micro  Color: Yellow Bilirubin: Neg mg/dL WBC/hpf: 0 - 5/hpf  Appearance: Clear Ketones: 1+ mg/dL RBC/hpf: 20 - 40/hpf  Specific Gravity: 1.025 Blood: 3+ ery/uL Bacteria: NS (Not Seen)  pH: <=5.0 Protein: 2+ mg/dL Cystals: NS (Not Seen)  Glucose: Trace mg/dL  Urobilinogen: 0.2 mg/dL Casts: NS (Not Seen)    Nitrites: Neg Trichomonas: Not Present    Leukocyte Esterase: Neg leu/uL Mucous: Not Present      Epithelial Cells: NS (Not Seen)      Yeast: NS (Not Seen)      Sperm: Not Present    ASSESSMENT:      ICD-10 Details  1 GU:   Ureteral calculus - N20.1    PLAN:           Document Letter(s):  Created for Patient: Clinical Summary    We discussed management options including medical expulsion therapy, shockwave lithotripsy, and ureteroscopy. Ultimately, the patient has opted for shock wave lithotripsy. I discussed with the patient the procedure in detail as well as the risk and benefits. The patient is aware that she may need additional procedures. She also is aware of the risks of hematoma and pain. We will try to get this patient's scheduled as soon as possible.         Notes:   Plan to proceed with ESWL today. HE hasn't taken any NSAIDs or anti-platelets since the pain started. He's been NPO since midnight.   cc: Wenda Low    * Signed by Louis Meckel, M.D. on 07/28/18 at 9:31 AM (EST)*  The information contained in this medical record document is considered private and confidential patient information. This information can only be used for the medical diagnosis and/or medical services that are being provided by the patient's selected caregivers. This information can only be distributed outside of the patient's care if the patient agrees and signs waivers of authorization for this information to be sent to an outside source or route.  Add: chart, labs and images reviewed - I discussed with Dr. Louis Meckel.

## 2018-07-29 ENCOUNTER — Encounter (HOSPITAL_COMMUNITY): Payer: Self-pay | Admitting: Urology

## 2018-08-11 DIAGNOSIS — N200 Calculus of kidney: Secondary | ICD-10-CM | POA: Diagnosis not present

## 2018-08-12 DIAGNOSIS — E1122 Type 2 diabetes mellitus with diabetic chronic kidney disease: Secondary | ICD-10-CM | POA: Diagnosis not present

## 2018-08-12 DIAGNOSIS — R972 Elevated prostate specific antigen [PSA]: Secondary | ICD-10-CM | POA: Diagnosis not present

## 2018-08-12 DIAGNOSIS — Z0000 Encounter for general adult medical examination without abnormal findings: Secondary | ICD-10-CM | POA: Diagnosis not present

## 2018-08-12 DIAGNOSIS — N400 Enlarged prostate without lower urinary tract symptoms: Secondary | ICD-10-CM | POA: Diagnosis not present

## 2018-08-12 DIAGNOSIS — I10 Essential (primary) hypertension: Secondary | ICD-10-CM | POA: Diagnosis not present

## 2018-08-12 DIAGNOSIS — Z1389 Encounter for screening for other disorder: Secondary | ICD-10-CM | POA: Diagnosis not present

## 2018-08-12 DIAGNOSIS — E7800 Pure hypercholesterolemia, unspecified: Secondary | ICD-10-CM | POA: Diagnosis not present

## 2018-08-12 DIAGNOSIS — N200 Calculus of kidney: Secondary | ICD-10-CM | POA: Diagnosis not present

## 2018-08-12 DIAGNOSIS — N182 Chronic kidney disease, stage 2 (mild): Secondary | ICD-10-CM | POA: Diagnosis not present

## 2018-08-19 DIAGNOSIS — N201 Calculus of ureter: Secondary | ICD-10-CM | POA: Diagnosis not present

## 2019-02-11 DIAGNOSIS — N400 Enlarged prostate without lower urinary tract symptoms: Secondary | ICD-10-CM | POA: Diagnosis not present

## 2019-02-11 DIAGNOSIS — E7800 Pure hypercholesterolemia, unspecified: Secondary | ICD-10-CM | POA: Diagnosis not present

## 2019-02-11 DIAGNOSIS — I10 Essential (primary) hypertension: Secondary | ICD-10-CM | POA: Diagnosis not present

## 2019-02-11 DIAGNOSIS — R972 Elevated prostate specific antigen [PSA]: Secondary | ICD-10-CM | POA: Diagnosis not present

## 2019-02-11 DIAGNOSIS — E1122 Type 2 diabetes mellitus with diabetic chronic kidney disease: Secondary | ICD-10-CM | POA: Diagnosis not present

## 2019-02-11 DIAGNOSIS — N529 Male erectile dysfunction, unspecified: Secondary | ICD-10-CM | POA: Diagnosis not present

## 2019-02-11 DIAGNOSIS — N182 Chronic kidney disease, stage 2 (mild): Secondary | ICD-10-CM | POA: Diagnosis not present

## 2019-07-13 DIAGNOSIS — E1165 Type 2 diabetes mellitus with hyperglycemia: Secondary | ICD-10-CM | POA: Diagnosis not present

## 2019-07-29 DIAGNOSIS — Z20828 Contact with and (suspected) exposure to other viral communicable diseases: Secondary | ICD-10-CM | POA: Diagnosis not present

## 2019-08-18 DIAGNOSIS — E1122 Type 2 diabetes mellitus with diabetic chronic kidney disease: Secondary | ICD-10-CM | POA: Diagnosis not present

## 2019-08-18 DIAGNOSIS — Z0000 Encounter for general adult medical examination without abnormal findings: Secondary | ICD-10-CM | POA: Diagnosis not present

## 2019-08-18 DIAGNOSIS — I10 Essential (primary) hypertension: Secondary | ICD-10-CM | POA: Diagnosis not present

## 2019-08-18 DIAGNOSIS — R972 Elevated prostate specific antigen [PSA]: Secondary | ICD-10-CM | POA: Diagnosis not present

## 2019-08-18 DIAGNOSIS — N182 Chronic kidney disease, stage 2 (mild): Secondary | ICD-10-CM | POA: Diagnosis not present

## 2019-08-18 DIAGNOSIS — Z1389 Encounter for screening for other disorder: Secondary | ICD-10-CM | POA: Diagnosis not present

## 2019-08-18 DIAGNOSIS — E7800 Pure hypercholesterolemia, unspecified: Secondary | ICD-10-CM | POA: Diagnosis not present

## 2019-08-26 ENCOUNTER — Ambulatory Visit: Payer: PPO | Attending: Internal Medicine

## 2019-08-26 DIAGNOSIS — Z23 Encounter for immunization: Secondary | ICD-10-CM

## 2019-08-26 NOTE — Progress Notes (Signed)
   Covid-19 Vaccination Clinic  Name:  Adam Whitaker    MRN: [id number] DOB: July 05, 1941  08/26/2019  [name] was observed post Covid-19 immunization for 15 minutes without incidence. He was provided with Vaccine Information Sheet and instruction to access the V-Safe system.   [name] was instructed to call 911 with any severe reactions post vaccine: Marland Kitchen Difficulty breathing  . Swelling of your face and throat  . A fast heartbeat  . A bad rash all over your body  . Dizziness and weakness    Immunizations Administered    Name Date Dose VIS Date Route   Pfizer COVID-19 Vaccine 08/26/2019 11:09 AM 0.3 mL 07/17/2019 Intramuscular   Manufacturer: [place]   Lot: [id number]   [place]: [id number]

## 2019-09-13 ENCOUNTER — Ambulatory Visit: Payer: PPO | Attending: Internal Medicine

## 2019-09-13 DIAGNOSIS — Z23 Encounter for immunization: Secondary | ICD-10-CM

## 2019-09-13 NOTE — Progress Notes (Signed)
   Covid-19 Vaccination Clinic  Name:  CANDIDO ZUKOSKI    MRN: [id number] DOB: 05-May-1941  09/13/2019  Mr. Henness was observed post Covid-19 immunization for 15 minutes without incidence. He was provided with Vaccine Information Sheet and instruction to access the V-Safe system.   Mr. Rainge was instructed to call 911 with any severe reactions post vaccine: Marland Kitchen Difficulty breathing  . Swelling of your face and throat  . A fast heartbeat  . A bad rash all over your body  . Dizziness and weakness    Immunizations Administered    Name Date Dose VIS Date Route   Pfizer COVID-19 Vaccine 09/13/2019  9:55 AM 0.3 mL 07/17/2019 Intramuscular   Manufacturer: [place]   Lot: [id number]   [place]: [id number]

## 2019-11-16 DIAGNOSIS — E1165 Type 2 diabetes mellitus with hyperglycemia: Secondary | ICD-10-CM | POA: Diagnosis not present

## 2019-11-20 DIAGNOSIS — N201 Calculus of ureter: Secondary | ICD-10-CM | POA: Diagnosis not present

## 2019-11-20 DIAGNOSIS — R1031 Right lower quadrant pain: Secondary | ICD-10-CM | POA: Diagnosis not present

## 2019-11-20 DIAGNOSIS — N132 Hydronephrosis with renal and ureteral calculous obstruction: Secondary | ICD-10-CM | POA: Diagnosis not present

## 2019-12-24 DIAGNOSIS — N200 Calculus of kidney: Secondary | ICD-10-CM | POA: Diagnosis not present

## 2020-01-28 DIAGNOSIS — N200 Calculus of kidney: Secondary | ICD-10-CM | POA: Diagnosis not present

## 2020-01-28 DIAGNOSIS — N528 Other male erectile dysfunction: Secondary | ICD-10-CM | POA: Diagnosis not present

## 2020-01-28 DIAGNOSIS — R972 Elevated prostate specific antigen [PSA]: Secondary | ICD-10-CM | POA: Diagnosis not present

## 2020-02-16 DIAGNOSIS — N182 Chronic kidney disease, stage 2 (mild): Secondary | ICD-10-CM | POA: Diagnosis not present

## 2020-02-16 DIAGNOSIS — I10 Essential (primary) hypertension: Secondary | ICD-10-CM | POA: Diagnosis not present

## 2020-02-16 DIAGNOSIS — N400 Enlarged prostate without lower urinary tract symptoms: Secondary | ICD-10-CM | POA: Diagnosis not present

## 2020-02-16 DIAGNOSIS — E7800 Pure hypercholesterolemia, unspecified: Secondary | ICD-10-CM | POA: Diagnosis not present

## 2020-02-16 DIAGNOSIS — K635 Polyp of colon: Secondary | ICD-10-CM | POA: Diagnosis not present

## 2020-02-16 DIAGNOSIS — E1122 Type 2 diabetes mellitus with diabetic chronic kidney disease: Secondary | ICD-10-CM | POA: Diagnosis not present

## 2020-05-29 IMAGING — CT CT ABD-PELV W/ CM
2 of 5 series · 16 of 46 positions shown, 18 images · IV contrast (APPLIED)
Comparison: None.

CLINICAL DATA: Right lower quadrant pain with vomiting since last
night history of nephrolithiasis

EXAM:
CT ABDOMEN AND PELVIS WITH CONTRAST
TECHNIQUE: Multidetector CT imaging of the abdomen and pelvis was performed
using the standard protocol following bolus administration of
intravenous contrast.
CONTRAST:  80mL OMNIPAQUE IOHEXOL 300 MG/ML  SOLN

[Series 3: abd/ pelvis 5.0 i30f 2 · axial · 0.97mm/px · z∈[+866,+1291]mm · 13 of 95 slices shown, 15 images]
[im 5/95  soft-tissue]
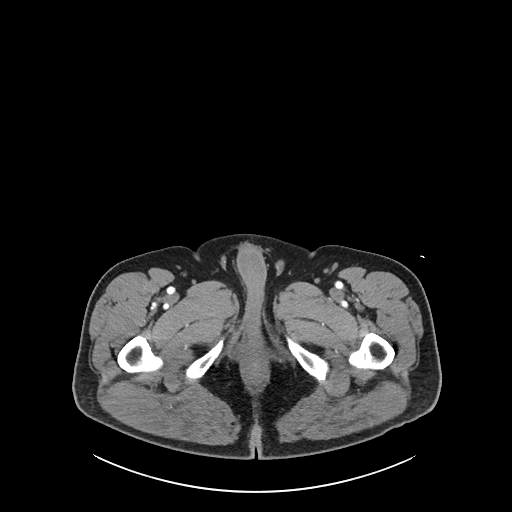
[im 5/95  bone]
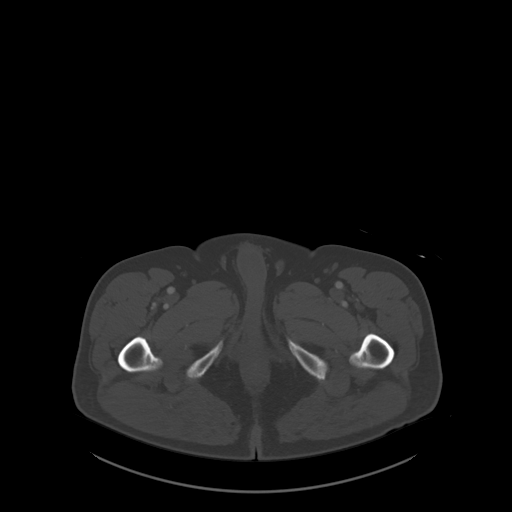
[im 15/95  soft-tissue]
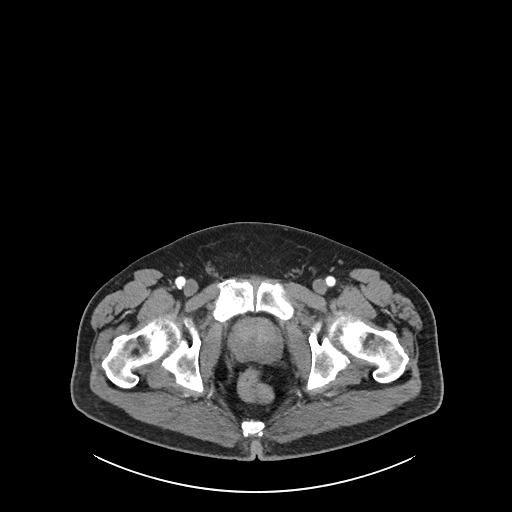
[im 19/95  soft-tissue]
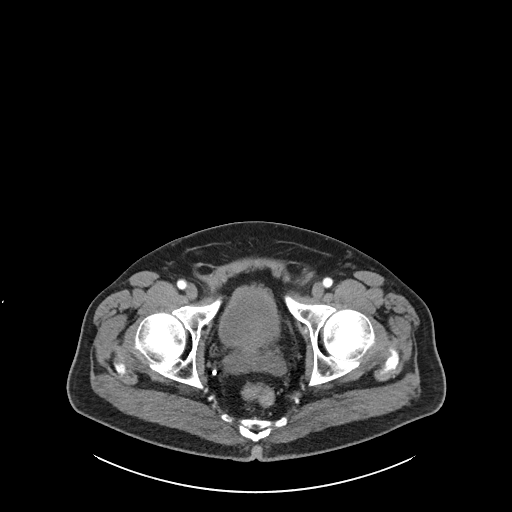
[im 29/95  soft-tissue]
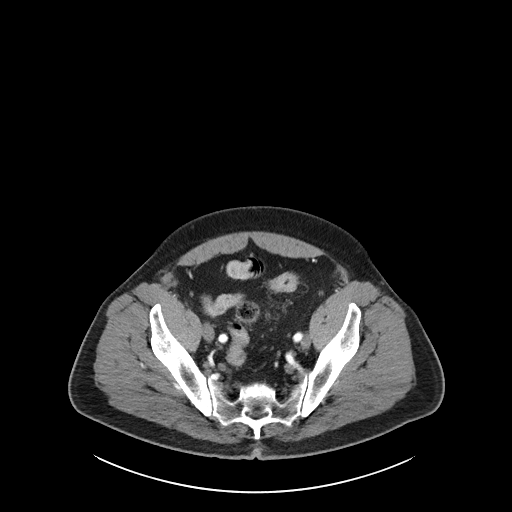
[im 33/95  soft-tissue]
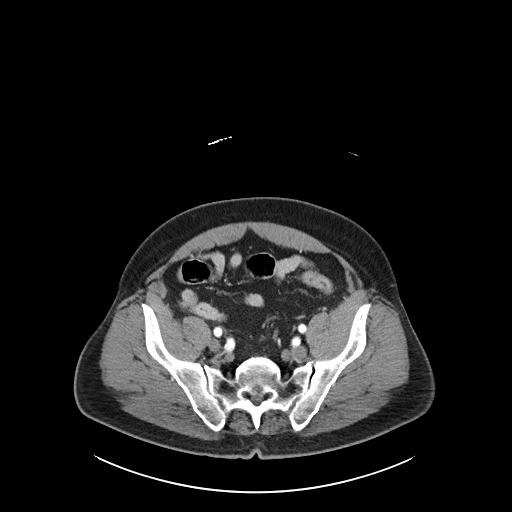
[im 43/95  soft-tissue]
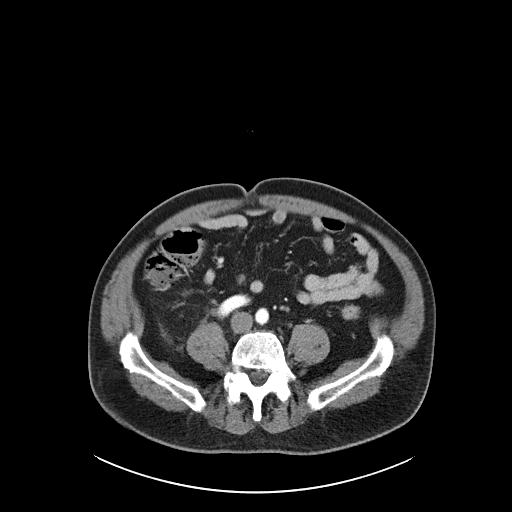
[im 48/95  soft-tissue]
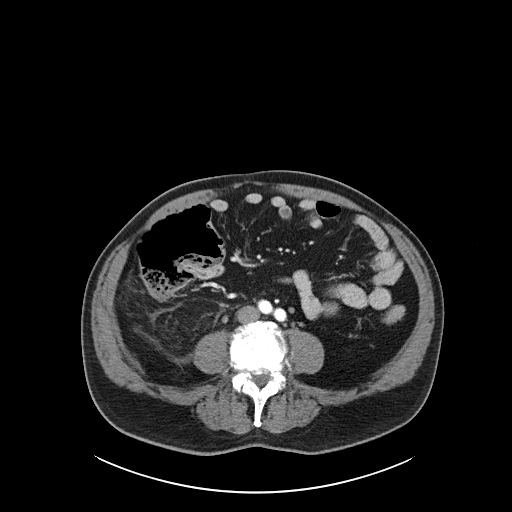
[im 52/95  soft-tissue]
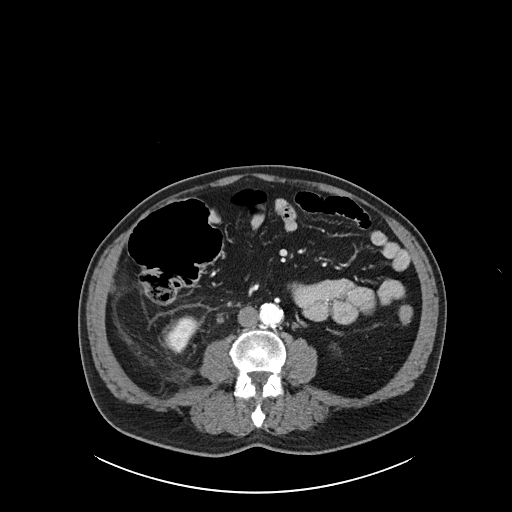
[im 62/95  soft-tissue]
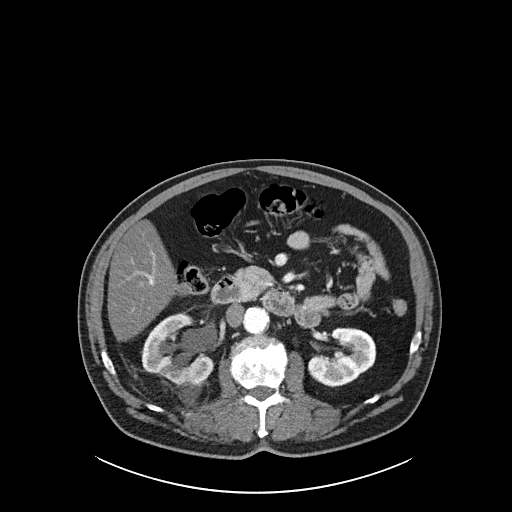
[im 62/95  bone]
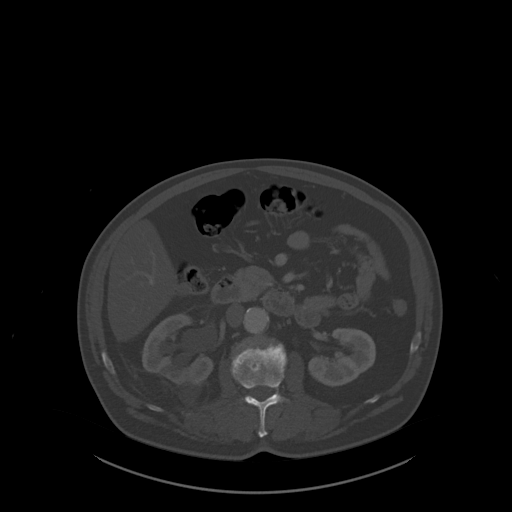
[im 66/95  soft-tissue]
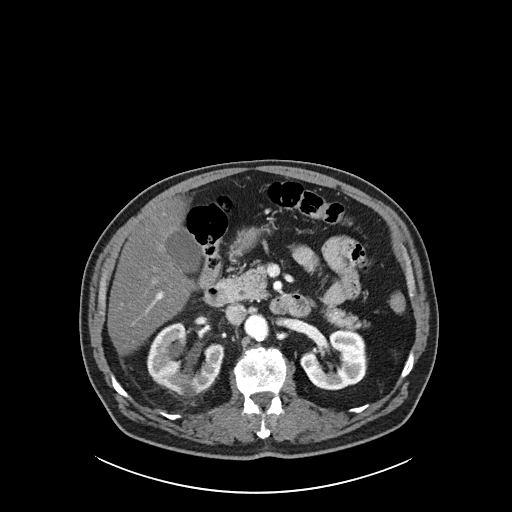
[im 76/95  soft-tissue]
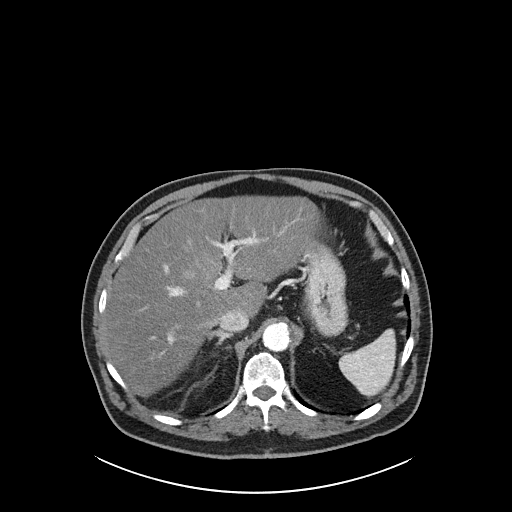
[im 80/95  soft-tissue]
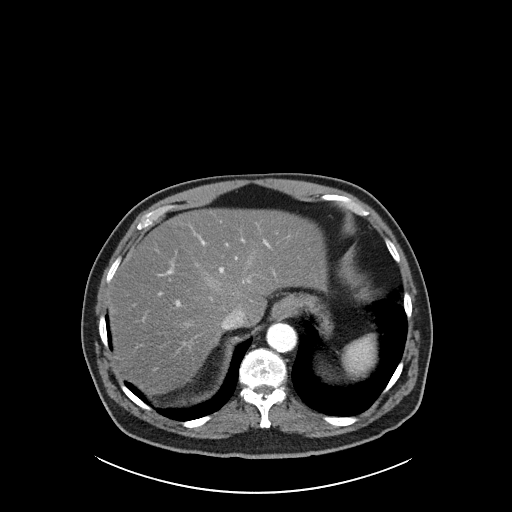
[im 90/95  soft-tissue]
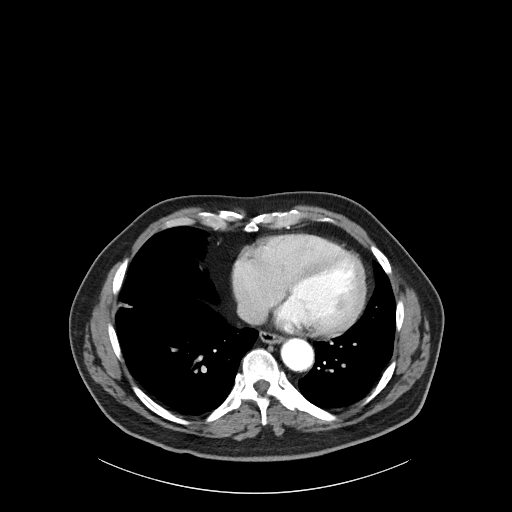

[Series 6: coronal soft tissue · coronal · 0.87mm/px · 3 of 126 slices shown]
[im 42/126  soft-tissue]
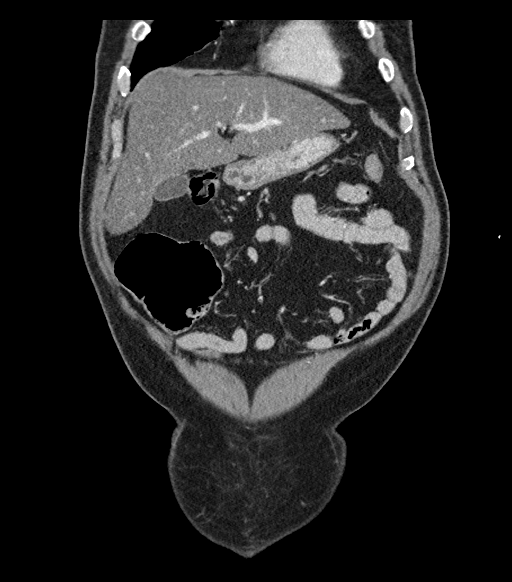
[im 56/126  soft-tissue]
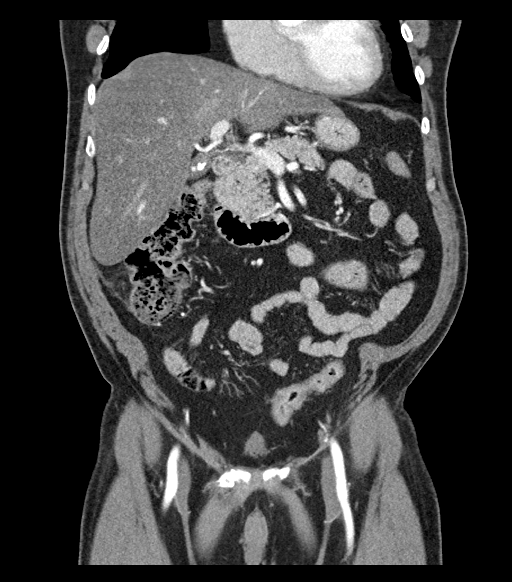
[im 70/126  soft-tissue]
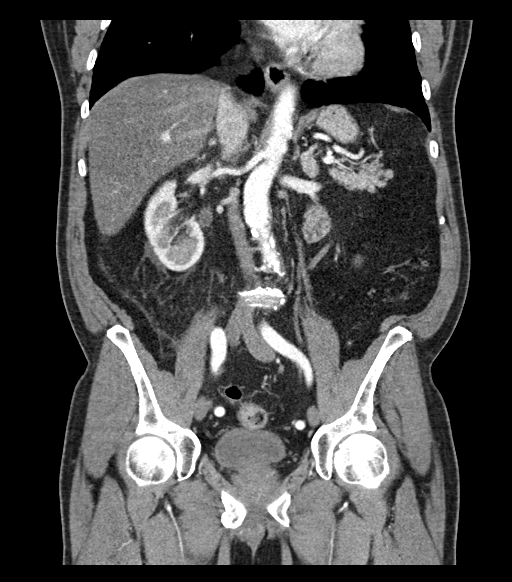

[16 of 46 positions shown; findings below may reference images not displayed]

FINDINGS: Lower chest: No acute abnormality.

Hepatobiliary: Marked hepatic hypoattenuation compatible with
hepatic steatosis. No focal hepatic abnormality or biliary
dilatation. Hepatic and portal veins are patent. Small gallstones
noted. Gallbladder is nondistended. Common bile duct nondilated.

Pancreas: Unremarkable. No pancreatic ductal dilatation or
surrounding inflammatory changes.

Spleen: Normal in size without focal abnormality.

Adrenals/Urinary Tract: Normal adrenal glands.

Right kidney demonstrates mild hydronephrosis secondary to an
obstructing proximal right ureteral 9 mm calculus, image 38 series
3. This is confirmed on the sagittal and coronal reconstructions.
Additional punctate nonobstructing right upper and lower pole
calculi noted. Midpole right posterior renal cyst measures 2.8 cm.
Mild perinephric strandy edema about the right kidney. This extends
along the right retroperitoneum.

Left kidney and ureter demonstrate no acute process or obstruction.
Left ureter nondilated.

Bladder is underdistended and has mild nonspecific wall thickening.

Stomach/Bowel: Negative for bowel obstruction, significant
dilatation, ileus, free air. Moderate distension of the cecum.
Appendix not visualized. No fluid collection or abscess. No free
fluid or ascites.

Vascular/Lymphatic: Aortic atherosclerosis noted. No significant
aneurysm or occlusive process. No retroperitoneal hemorrhage or
hematoma. Mesenteric and renal vasculature remain patent.

No adenopathy.

Reproductive: Prostate gland is enlarged. Symmetric seminal
vesicles. No other significant finding by CT.

Other: Small fat containing inguinal hernias bilaterally. Intact
abdominal wall. No ascites.

Musculoskeletal: Degenerative changes of the spine. No acute osseous
finding.
IMPRESSION: 9 mm mildly obstructing proximal right ureteral calculus with
associated mild right hydronephrosis and perinephric inflammation.

Additional punctate nonobstructing right nephrolithiasis

Cholelithiasis

Hepatic steatosis

Abdominal atherosclerosis

## 2020-08-12 DIAGNOSIS — Z1152 Encounter for screening for COVID-19: Secondary | ICD-10-CM | POA: Diagnosis not present

## 2020-08-30 DIAGNOSIS — E1122 Type 2 diabetes mellitus with diabetic chronic kidney disease: Secondary | ICD-10-CM | POA: Diagnosis not present

## 2020-08-30 DIAGNOSIS — N400 Enlarged prostate without lower urinary tract symptoms: Secondary | ICD-10-CM | POA: Diagnosis not present

## 2020-08-30 DIAGNOSIS — I10 Essential (primary) hypertension: Secondary | ICD-10-CM | POA: Diagnosis not present

## 2020-08-30 DIAGNOSIS — N200 Calculus of kidney: Secondary | ICD-10-CM | POA: Diagnosis not present

## 2020-08-30 DIAGNOSIS — Z1389 Encounter for screening for other disorder: Secondary | ICD-10-CM | POA: Diagnosis not present

## 2020-08-30 DIAGNOSIS — Z0000 Encounter for general adult medical examination without abnormal findings: Secondary | ICD-10-CM | POA: Diagnosis not present

## 2020-08-30 DIAGNOSIS — N182 Chronic kidney disease, stage 2 (mild): Secondary | ICD-10-CM | POA: Diagnosis not present

## 2020-08-30 DIAGNOSIS — E7800 Pure hypercholesterolemia, unspecified: Secondary | ICD-10-CM | POA: Diagnosis not present

## 2020-09-28 DIAGNOSIS — D696 Thrombocytopenia, unspecified: Secondary | ICD-10-CM | POA: Diagnosis not present

## 2021-02-27 DIAGNOSIS — N200 Calculus of kidney: Secondary | ICD-10-CM | POA: Diagnosis not present

## 2021-02-27 DIAGNOSIS — R972 Elevated prostate specific antigen [PSA]: Secondary | ICD-10-CM | POA: Diagnosis not present

## 2021-02-27 DIAGNOSIS — I10 Essential (primary) hypertension: Secondary | ICD-10-CM | POA: Diagnosis not present

## 2021-02-27 DIAGNOSIS — E1122 Type 2 diabetes mellitus with diabetic chronic kidney disease: Secondary | ICD-10-CM | POA: Diagnosis not present

## 2021-02-27 DIAGNOSIS — Z7984 Long term (current) use of oral hypoglycemic drugs: Secondary | ICD-10-CM | POA: Diagnosis not present

## 2021-02-27 DIAGNOSIS — N182 Chronic kidney disease, stage 2 (mild): Secondary | ICD-10-CM | POA: Diagnosis not present

## 2021-03-08 DIAGNOSIS — R972 Elevated prostate specific antigen [PSA]: Secondary | ICD-10-CM | POA: Diagnosis not present

## 2021-03-16 DIAGNOSIS — N200 Calculus of kidney: Secondary | ICD-10-CM | POA: Diagnosis not present

## 2021-09-04 DIAGNOSIS — R972 Elevated prostate specific antigen [PSA]: Secondary | ICD-10-CM | POA: Diagnosis not present

## 2021-09-04 DIAGNOSIS — E1122 Type 2 diabetes mellitus with diabetic chronic kidney disease: Secondary | ICD-10-CM | POA: Diagnosis not present

## 2021-09-04 DIAGNOSIS — E7800 Pure hypercholesterolemia, unspecified: Secondary | ICD-10-CM | POA: Diagnosis not present

## 2021-09-04 DIAGNOSIS — I10 Essential (primary) hypertension: Secondary | ICD-10-CM | POA: Diagnosis not present

## 2021-09-04 DIAGNOSIS — N182 Chronic kidney disease, stage 2 (mild): Secondary | ICD-10-CM | POA: Diagnosis not present

## 2021-09-04 DIAGNOSIS — Z23 Encounter for immunization: Secondary | ICD-10-CM | POA: Diagnosis not present

## 2021-09-04 DIAGNOSIS — Z0000 Encounter for general adult medical examination without abnormal findings: Secondary | ICD-10-CM | POA: Diagnosis not present

## 2021-09-04 DIAGNOSIS — N400 Enlarged prostate without lower urinary tract symptoms: Secondary | ICD-10-CM | POA: Diagnosis not present

## 2021-09-04 DIAGNOSIS — Z1389 Encounter for screening for other disorder: Secondary | ICD-10-CM | POA: Diagnosis not present

## 2021-09-04 DIAGNOSIS — N200 Calculus of kidney: Secondary | ICD-10-CM | POA: Diagnosis not present

## 2022-03-07 DIAGNOSIS — N400 Enlarged prostate without lower urinary tract symptoms: Secondary | ICD-10-CM | POA: Diagnosis not present

## 2022-03-07 DIAGNOSIS — E7800 Pure hypercholesterolemia, unspecified: Secondary | ICD-10-CM | POA: Diagnosis not present

## 2022-03-07 DIAGNOSIS — R972 Elevated prostate specific antigen [PSA]: Secondary | ICD-10-CM | POA: Diagnosis not present

## 2022-03-07 DIAGNOSIS — I10 Essential (primary) hypertension: Secondary | ICD-10-CM | POA: Diagnosis not present

## 2022-03-07 DIAGNOSIS — E1122 Type 2 diabetes mellitus with diabetic chronic kidney disease: Secondary | ICD-10-CM | POA: Diagnosis not present

## 2022-03-07 DIAGNOSIS — N182 Chronic kidney disease, stage 2 (mild): Secondary | ICD-10-CM | POA: Diagnosis not present

## 2022-03-07 DIAGNOSIS — E1165 Type 2 diabetes mellitus with hyperglycemia: Secondary | ICD-10-CM | POA: Diagnosis not present
